# Patient Record
Sex: Female | Born: 1980 | Race: White | Hispanic: No | Marital: Married | Smoking: Never smoker
Health system: Southern US, Community
[De-identification: ages and names within clinical notes are randomized; demographics above are authoritative.]

## PROBLEM LIST (undated history)

## (undated) ENCOUNTER — Inpatient Hospital Stay (HOSPITAL_COMMUNITY): Payer: Self-pay

## (undated) DIAGNOSIS — I493 Ventricular premature depolarization: Secondary | ICD-10-CM

## (undated) DIAGNOSIS — I499 Cardiac arrhythmia, unspecified: Secondary | ICD-10-CM

## (undated) DIAGNOSIS — K834 Spasm of sphincter of Oddi: Secondary | ICD-10-CM

## (undated) DIAGNOSIS — O24419 Gestational diabetes mellitus in pregnancy, unspecified control: Secondary | ICD-10-CM

## (undated) DIAGNOSIS — N200 Calculus of kidney: Secondary | ICD-10-CM

## (undated) HISTORY — PX: CHOLECYSTECTOMY: SHX55

## (undated) HISTORY — DX: Calculus of kidney: N20.0

## (undated) HISTORY — DX: Ventricular premature depolarization: I49.3

---
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
## 2000-08-26 ENCOUNTER — Other Ambulatory Visit: Admission: RE | Admit: 2000-08-26 | Discharge: 2000-08-26 | Payer: Self-pay | Admitting: Internal Medicine

## 2000-10-10 ENCOUNTER — Emergency Department (HOSPITAL_COMMUNITY): Admission: EM | Admit: 2000-10-10 | Discharge: 2000-10-11 | Payer: Self-pay | Admitting: Emergency Medicine

## 2001-06-18 ENCOUNTER — Encounter: Payer: Self-pay | Admitting: Emergency Medicine

## 2001-06-18 ENCOUNTER — Emergency Department (HOSPITAL_COMMUNITY): Admission: EM | Admit: 2001-06-18 | Discharge: 2001-06-18 | Payer: Self-pay | Admitting: Emergency Medicine

## 2001-07-29 ENCOUNTER — Observation Stay (HOSPITAL_COMMUNITY): Admission: RE | Admit: 2001-07-29 | Discharge: 2001-07-30 | Payer: Self-pay | Admitting: *Deleted

## 2001-07-29 ENCOUNTER — Encounter: Payer: Self-pay | Admitting: *Deleted

## 2001-08-01 ENCOUNTER — Inpatient Hospital Stay (HOSPITAL_COMMUNITY): Admission: EM | Admit: 2001-08-01 | Discharge: 2001-08-04 | Payer: Self-pay | Admitting: Emergency Medicine

## 2001-08-01 ENCOUNTER — Encounter: Payer: Self-pay | Admitting: *Deleted

## 2001-08-02 ENCOUNTER — Encounter: Payer: Self-pay | Admitting: Surgery

## 2001-08-03 ENCOUNTER — Encounter: Payer: Self-pay | Admitting: *Deleted

## 2001-08-04 ENCOUNTER — Encounter: Payer: Self-pay | Admitting: *Deleted

## 2001-09-13 ENCOUNTER — Emergency Department (HOSPITAL_COMMUNITY): Admission: EM | Admit: 2001-09-13 | Discharge: 2001-09-13 | Payer: Self-pay | Admitting: Emergency Medicine

## 2001-10-06 ENCOUNTER — Other Ambulatory Visit: Admission: RE | Admit: 2001-10-06 | Discharge: 2001-10-06 | Payer: Self-pay | Admitting: Internal Medicine

## 2002-11-13 ENCOUNTER — Other Ambulatory Visit: Admission: RE | Admit: 2002-11-13 | Discharge: 2002-11-13 | Payer: Self-pay | Admitting: Internal Medicine

## 2003-08-02 ENCOUNTER — Other Ambulatory Visit: Admission: RE | Admit: 2003-08-02 | Discharge: 2003-08-02 | Payer: Self-pay | Admitting: Obstetrics & Gynecology

## 2003-08-02 ENCOUNTER — Inpatient Hospital Stay (HOSPITAL_COMMUNITY): Admission: AD | Admit: 2003-08-02 | Discharge: 2003-08-02 | Payer: Self-pay | Admitting: *Deleted

## 2004-02-01 ENCOUNTER — Inpatient Hospital Stay (HOSPITAL_COMMUNITY): Admission: AD | Admit: 2004-02-01 | Discharge: 2004-02-01 | Payer: Self-pay | Admitting: Obstetrics & Gynecology

## 2004-02-10 ENCOUNTER — Inpatient Hospital Stay (HOSPITAL_COMMUNITY): Admission: AD | Admit: 2004-02-10 | Discharge: 2004-02-10 | Payer: Self-pay | Admitting: Obstetrics and Gynecology

## 2004-02-13 ENCOUNTER — Inpatient Hospital Stay (HOSPITAL_COMMUNITY): Admission: AD | Admit: 2004-02-13 | Discharge: 2004-02-16 | Payer: Self-pay | Admitting: Obstetrics & Gynecology

## 2004-03-26 ENCOUNTER — Other Ambulatory Visit: Admission: RE | Admit: 2004-03-26 | Discharge: 2004-03-26 | Payer: Self-pay | Admitting: Obstetrics & Gynecology

## 2004-04-14 ENCOUNTER — Emergency Department (HOSPITAL_COMMUNITY): Admission: EM | Admit: 2004-04-14 | Discharge: 2004-04-14 | Payer: Self-pay

## 2004-04-18 ENCOUNTER — Emergency Department (HOSPITAL_COMMUNITY): Admission: EM | Admit: 2004-04-18 | Discharge: 2004-04-18 | Payer: Self-pay | Admitting: Family Medicine

## 2005-03-07 ENCOUNTER — Emergency Department (HOSPITAL_COMMUNITY): Admission: EM | Admit: 2005-03-07 | Discharge: 2005-03-07 | Payer: Self-pay | Admitting: Emergency Medicine

## 2005-03-31 ENCOUNTER — Encounter: Admission: RE | Admit: 2005-03-31 | Discharge: 2005-03-31 | Payer: Self-pay | Admitting: Gastroenterology

## 2005-04-21 ENCOUNTER — Encounter: Admission: RE | Admit: 2005-04-21 | Discharge: 2005-04-21 | Payer: Self-pay | Admitting: Gastroenterology

## 2005-07-29 ENCOUNTER — Other Ambulatory Visit: Admission: RE | Admit: 2005-07-29 | Discharge: 2005-07-29 | Payer: Self-pay | Admitting: Obstetrics & Gynecology

## 2006-01-27 ENCOUNTER — Emergency Department (HOSPITAL_COMMUNITY): Admission: EM | Admit: 2006-01-27 | Discharge: 2006-01-27 | Payer: Self-pay | Admitting: Family Medicine

## 2007-05-27 ENCOUNTER — Emergency Department (HOSPITAL_COMMUNITY): Admission: EM | Admit: 2007-05-27 | Discharge: 2007-05-27 | Payer: Self-pay | Admitting: Emergency Medicine

## 2007-06-01 DIAGNOSIS — A048 Other specified bacterial intestinal infections: Secondary | ICD-10-CM | POA: Insufficient documentation

## 2007-07-22 ENCOUNTER — Encounter: Admission: RE | Admit: 2007-07-22 | Discharge: 2007-07-22 | Payer: Self-pay | Admitting: Family Medicine

## 2007-09-14 ENCOUNTER — Ambulatory Visit (HOSPITAL_COMMUNITY): Admission: RE | Admit: 2007-09-14 | Discharge: 2007-09-14 | Payer: Self-pay | Admitting: General Surgery

## 2007-09-14 ENCOUNTER — Encounter (INDEPENDENT_AMBULATORY_CARE_PROVIDER_SITE_OTHER): Payer: Self-pay | Admitting: General Surgery

## 2007-09-15 ENCOUNTER — Inpatient Hospital Stay (HOSPITAL_COMMUNITY): Admission: EM | Admit: 2007-09-15 | Discharge: 2007-09-17 | Payer: Self-pay | Admitting: Emergency Medicine

## 2008-05-01 ENCOUNTER — Emergency Department (HOSPITAL_COMMUNITY): Admission: EM | Admit: 2008-05-01 | Discharge: 2008-05-01 | Payer: Self-pay | Admitting: Emergency Medicine

## 2008-07-13 DIAGNOSIS — D119 Benign neoplasm of major salivary gland, unspecified: Secondary | ICD-10-CM | POA: Insufficient documentation

## 2009-11-18 ENCOUNTER — Observation Stay (HOSPITAL_COMMUNITY): Admission: AD | Admit: 2009-11-18 | Discharge: 2009-11-20 | Payer: Self-pay | Admitting: Obstetrics and Gynecology

## 2009-11-22 ENCOUNTER — Emergency Department (HOSPITAL_COMMUNITY): Admission: EM | Admit: 2009-11-22 | Discharge: 2009-11-22 | Payer: Self-pay | Admitting: Emergency Medicine

## 2010-07-19 LAB — URINALYSIS, ROUTINE W REFLEX MICROSCOPIC
Bilirubin Urine: NEGATIVE
Glucose, UA: NEGATIVE mg/dL
Ketones, ur: NEGATIVE mg/dL
Leukocytes, UA: NEGATIVE
Nitrite: NEGATIVE
Protein, ur: NEGATIVE mg/dL
Specific Gravity, Urine: >1.03 — ABNORMAL HIGH (ref 1.005–1.030)
Urobilinogen, UA: 0.2 mg/dL (ref 0.0–1.0)
pH: 6 (ref 5.0–8.0)

## 2010-07-19 LAB — CBC
HCT: 40.4 % (ref 36.0–46.0)
Hemoglobin: 13.7 g/dL (ref 12.0–15.0)
MCH: 30.3 pg (ref 26.0–34.0)
MCHC: 33.8 g/dL (ref 30.0–36.0)
MCV: 89.6 fL (ref 78.0–100.0)
Platelets: 206 10*3/uL (ref 150–400)
RBC: 4.51 MIL/uL (ref 3.87–5.11)
RDW: 13 % (ref 11.5–15.5)
WBC: 9 10*3/uL (ref 4.0–10.5)

## 2010-07-19 LAB — POCT PREGNANCY, URINE: Preg Test, Ur: NEGATIVE

## 2010-07-19 LAB — STONE ANALYSIS: Stone Weight KSTONE: 0.005 g

## 2010-07-19 LAB — URINE MICROSCOPIC-ADD ON

## 2010-09-16 NOTE — Op Note (Signed)
NAME:  Melanie Joyce NO.:  [id number]   MEDICAL RECORD NO.:  [id number]          PATIENT TYPE:  AMB   LOCATION:  DAY                          FACILITY:  [facility]   PHYSICIAN:  Adolph Pollack, M.D.DATE OF BIRTH:  1980-12-23   DATE OF PROCEDURE:  09/14/2007  DATE OF DISCHARGE:                               OPERATIVE REPORT   PREOPERATIVE DIAGNOSIS:  Biliary dyskinesia.   POSTOPERATIVE DIAGNOSIS:  Biliary dyskinesia.   PROCEDURE:  Laparoscopic cholecystectomy with intraoperative  cholangiogram.   SURGEON:  Adolph Pollack, M.D.   ASSISTANT:  Wilmon Arms. Tsuei, M.D.   ANESTHESIA:  General.   INDICATIONS:  This 30 year old female has been having problems with  intermittent right upper quadrant pain, nausea and vomiting.  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  She had a recent ultrasound demonstrating gallbladder  polyps.  Apparently the biliary scan demonstrated a slightly depressed  gallbladder ejection fraction.  She continues to be symptomatic.  After  discussion with her about the procedure and its success rate, she has  elected to undergo laparoscopic cholecystectomy.   TECHNIQUE:  She is seen in the holding area and brought to the operating  room, placed supine on the operating table and general anesthetic was  administered.  The abdominal wall was sterilely prepped and draped.  Marcaine was infiltrated in the subumbilical region.  A subumbilical  incision was made through the skin, subcutaneous tissue, fascia and  peritoneum entering the peritoneal cavity under direct vision.  A  pursestring suture of 0-0 Vicryl was placed around the fascial edges.  A  Hasson trocar was introduced into the peritoneal cavity and  pneumoperitoneum created by insufflation of CO2 gas.   She was then placed in reverse Trendelenburg position, the right side  tilted slightly up.  An 11 mm trocar was placed an  epigastric incision  and two 5 mm trocars placed in the right mid lateral abdomen.  The  gallbladder was visualized and had a pale white reddish color at the  fundus area.  Fundus was grasped, retracted toward the right shoulder.  No significant adhesions were noted.  The infundibulum was grasped,  retracted laterally and then it was mobilized using careful dissection  on the gallbladder.  The cystic duct was isolated.  Using blunt  dissection, a window was created around it.  A clip was placed just  above the cystic duct gallbladder junction.  A small incision was made  at the cystic duct gallbladder junction.  A cholangiocath was passed  through the anterior abdominal wall and placed into the cystic duct and  a cholangiogram was performed.   Under real time fluoroscopy, dilute contrast was injected into the  cystic duct which was of moderate length.  Common hepatic, right and  left hepatic, and common bile ducts all filled promptly and contrast  drained promptly into the duodenum without obvious evidence of  obstruction.  Final reports pending the radiologist's interpretation.   Following this, the cholangiocatheter was removed, the cystic duct was  then clipped 3 times on the biliary  side and then divided.  The cystic  artery was identified and a window created around it close to the  gallbladder.  It was clipped and divided.  The gallbladder was then  dissected free from liver using electrocautery.  The gallbladder was  placed in an Endopouch bag.  I inspected the gallbladder fossa and  controlled bleeding with electrocautery.  I then irrigated the area  copiously with saline solution.  Further inspection demonstrated  hemostasis was adequate.  There was no bile leak.  Fluid was evacuated.   The gallbladder was then removed through the subumbilical port.  The  subumbilical fascial defect was closed under laparoscopic vision by  tightening up and tying down the pursestring suture.   Remaining trocars  were removed and the pneumoperitoneum was released.   Skin incisions were closed with 4-0 Monocryl subcuticular stitches  followed by Steri-Strips and sterile dressings.  She tolerated the  procedure well without apparent complications and was taken to the  recovery room in satisfactory condition.      Adolph Pollack, M.D.  Electronically Signed     TJR/MEDQ  D:  09/14/2007  T:  09/14/2007  Job:  [id number]   cc:   Maryelizabeth Rowan, M.D.

## 2010-09-19 NOTE — Procedures (Signed)
[facility]  Patient:    Melanie Joyce Visit Number: [id number] MRN: [id number]          Service Type: SUR Location: [phone] Attending Physician:  Sabino Gasser Dictated by:   Sabino Gasser, M.D. Proc. Date: 07/29/01 Admit Date:  07/29/2001                             Procedure Report  PROCEDURE:  Endoscopic retrograde cholangiopancreatography with sphincterotomy.  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  ANESTHESIA:  Demerol 100, Versed 11.5 mg.  DESCRIPTION OF PROCEDURE:  With the patient mildly sedated in the prone position, the Olympus videoscopic side-viewing duodenoscope was inserted in the mouth and passed through the esophagus into the stomach and duodenum where the ampulla of Vater was visualized, photographed and appeared normal. Subsequently the Wilson-Cook tritome catheter was advanced through the endoscope and cannulation of the ampulla was easily made. A small amount of contrast was injected which outlined the beginnings of the pancreatic duct which appeared grossly normal and we stopped with the injection. Subsequently with change of position, a guidewire was passed and it allowed [place] to enter the common bile duct. With the guidewire in place, the catheter was passed proximally into the biliary tree, contrast was injected and outlined what appeared to be normal intra and extrahepatic bile ducts and a normal gallbladder. A radiograph was taken at this point. Subsequently then the tritome catheter was pulled back and a sphincterotomy was made. It was noted that we had good drainage of the contrast material with the sphincterotomy and some bile was seen to emanate from the ampulla. Therefore at that point, with the sphincterotomy made up to the overlying fold, we withdrew the catheter and removed the endoscope. The patients vital signs and pulse oximeter remained stable. The patient  tolerated the procedure well without apparent complications.  FINDINGS:  Essentially normal biliary and pancreatic ducts that were seen presumed sphincter of Oddi dysfunction, therefore, a sphincterotomy done.  PLAN:  Will have the patient on a clear liquid diet today and advance as tolerated in the morning. Have the patient follow-up with me as needed and in approximately six weeks. Dictated by:   Sabino Gasser, M.D. Attending Physician:  Sabino Gasser DD:  07/29/01 TD:  07/29/01 Job: [id number]

## 2010-09-19 NOTE — Discharge Summary (Signed)
[facility]  Patient:    Melanie Joyce [facility] Visit Number: [id number] MRN: [id number]          Service Type: MED Location: [phone] Attending Physician:  Sabino Gasser Dictated by:   Sabino Gasser, M.D. Admit Date:  08/01/2001 Discharge Date: 08/04/2001                             Discharge Summary  HISTORY OF PRESENT ILLNESS:  Ms. Melanie Joyce is a 30 year old lady who was admitted with abdominal pain. The patient had ERCP on Friday prior to admission.  She was discharged home the following day.  On Sunday evening, early Monday morning, the patient developed abdominal pain and came to the emergency room at our suggestion.  HOSPITAL COURSE:  Laboratory studies were unremarkable; however, an x-ray of the abdomen revealed pneumoperitoneum.  The patient was admitted.  Actually there appeared to be retroperitoneal air, but the patients exam was unremarkable, and labs were unremarkable as were vital signs.  The patient was admitted for observation and seen in consultation by Dr. Jamey Ripa of the surgical service.  It was felt that she could be admitted and observed, and the patients condition improved throughout her hospital stay.  Abdomen remained benign.  Vital signs remained stable.  The patient tolerated a regular diet subsequently and was discharged to be followed by me as an outpatient.  MEDICATIONS:  The patient was on no medications.  DISCHARGE LABS:  Hemoglobin 13.0, white count 7300.  Electrolytes, liver function tests, amylase, lipase were all normal.  A gastrograph and swallow was done during this hospitalization, which showed no extravasation.  Follow-up x-ray showed improvement of the retroperitoneal air.  DISPOSITION:  The patient was discharged in improved condition, on no medications.  To be followed up as described. Dictated by:   Sabino Gasser, M.D. Attending Physician:  Sabino Gasser DD:  08/15/01 TD:  08/15/01 Job: [id number]

## 2010-09-19 NOTE — H&P (Signed)
NAME:  Melanie Joyce NO.:  [id number]   MEDICAL RECORD NO.:  [id number]          PATIENT TYPE:  INP   LOCATION:  9168                          FACILITY:  [facility]   PHYSICIAN:  Guy Sandifer. Tomblin II, M.D.DATE OF BIRTH:  [birth year]   DATE OF ADMISSION:  02/13/2004  DATE OF DISCHARGE:                                HISTORY & PHYSICAL   CHIEF COMPLAINT:  Intrauterine pregnancy at 37-6/7 weeks with cholestasis of  pregnancy.   HISTORY OF PRESENT ILLNESS:  This patient is a 30 year old white female, G1,  P0 with an EDC of February 28, 2004, established by 5-6 ultrasound, presented  to triage two to three days ago with itching of the soles of her feet and  the palms of her hands, especially at night.  They have grown progressively  worse.  Lab results returned today revealing an elevation of the bile acids  to 34, consistent with cholestasis of pregnancy.  CMV titer for IgG was  positive at 7.62.  IgM is pending.  Nonstress in the office revealed one  variable deceleration with a contraction.  Risks of cholestasis during the  pregnancy are discussed with the patient.  She is being admitted for a two  stage induction of labor.  She denies central nervous system changes,  bleeding, or leaking of fluid.   PAST MEDICAL HISTORY/PAST MEDICAL HISTORY/PAST OBSTETRICAL HISTORY/FAMILY  HISTORY:  Per prenatal history.   MEDICATIONS:  Prenatal vitamins.   ALLERGIES:  No known drug allergies.   SOCIAL HISTORY:  Denies alcohol, tobacco or drug abuse.   REVIEW OF SYSTEMS:  Neurological:  Denies headache.  Cardiac:  Denies chest  pain.  Pulmonary:  Denies shortness of breath.  Gastrointestinal:  Denies  recent changes in bowel habits.   PHYSICAL EXAMINATION:  VITAL SIGNS:  Height 5 feet 5 inches, weight 143-1/2  pounds.  Blood pressure 112/72.  HEENT:  Without thyromegaly.  LUNGS:  Clear to auscultation.  HEART:  Regular rate and rhythm.  BACK:  Without CVA tenderness.  BREASTS:   Not examined.  ABDOMEN:  Gravida with no epigastric tenderness.  PELVIC:  The cervix is closed, 50% effaced, -2 station and vertex.  EXTREMITIES:  1+ edema.  Deep tendon reflexes are 2+ without clonus.   LABORATORY DATA:  Blood type O positive.  Rh antibody screen negative.  Toxoplasmosis titer negative.  RPR nonreactive.  Rubella titer immune.  Hepatitis B surface antigen is negative.  HIV nonreactive.  Gonorrhea and  Chlamydia negative.   ASSESSMENT:  1.  Intrauterine pregnancy at 37-6/7 weeks.  2.  Cholestasis of pregnancy.   PLAN:  Two stage induction of labor.      JET/MEDQ  D:  02/13/2004  T:  02/13/2004  Job:  [id number]

## 2010-09-19 NOTE — Consult Note (Signed)
[facility]  Patient:    Melanie Joyce Visit Number: [id number] MRN: [id number]          Service Type: MED Location: [phone] Attending Physician:  Sabino Gasser Dictated by:   Currie Paris, M.D. Proc. Date: 08/01/01 Admit Date:  08/01/2001   CC:         Sabino Gasser, M.D.   Consultation Report  VISIT NUMBER:  [id number]  REASON FOR CONSULTATION:  Abdominal pain and retroperitoneal air, status post endoscopic retrograde cholangiopancreatography.  CLINICAL HISTORY:  Ms. Melanie Joyce is a 30 year old woman who underwent endoscopic retrograde cholangiopancreatography and sphincterotomy a few days ago.  She apparently developed some abdominal pain shortly thereafter, and over the weekend has continued having pain all weekend.  She has had a minimal amount of nausea, but no vomiting.  She said she did have a little bit of diarrhea. She presented back today, and CT scan was obtained which shows what appears to be retroperitoneal air, some air in the mediastinum, perhaps a small amount of free air.  Interesting to note, is that there is no evidence of leakage of contrast out of the stomach, and contrast gets all the way into the colon.  In addition, there was no evidence of fluid collections or abscesses present.  PAST MEDICAL HISTORY:  The patient states that she is generally in good health.  She is only on birth control pills.  ALLERGIES:  No known drug allergies.  PAST SURGICAL HISTORY:  None.  PHYSICAL EXAMINATION:  GENERAL:  The patient is alert, but a little bit sleepy secondary to her Demerol.  VITAL SIGNS:  Stable in the emergency room.  She is in no distress, and appears reasonably comfortable.  HEART:  She is not tachypneic or tachycardic particularly.  No murmurs, rubs, or gallops.  HEENT:  Head is normocephalic, eyes are nonicteric.  Pupils are round and regular.  NECK:  Supple, no masses or thyromegaly.  LUNGS:  Clear to  auscultation.  ABDOMEN:  Basically soft, although she has a little bit of tenderness in the right upper quadrant.  There is not any significant guarding, and as noted, the abdomen is soft without any diffuse guarding, and there is no rebound present.  Bowel sounds are present and normal.  EXTREMITIES:  No cyanosis or edema.  LABORATORY DATA:  Slightly low potassium of 3.3.  White count 10,500, but no significant left shift.  Lipase and amylase are normal.  I reviewed the CT scan with radiology, and there does appear to be air as described above.  IMPRESSION:  Probable duodenal perforation, most likely occurring at the time of the endoscopic retrograde cholangiopancreatography.  I suspect a large quantity of air is secondary to the insufflation occurring during the endoscopic retrograde cholangiopancreatography.  However, I do not believe there is any ongoing infectious process, and I believe the leak has most likely sealed fairly quickly, and was probably only a microscopic perforation based on her clinical course.  RECOMMENDATIONS:  The current plans I believe are appropriate.  Dr. Virginia Rochester has ordered antibiotics, and scheduled both a HIDA scan to rule out a bile leak, and a Gastrografin study to better delineate whether there is any contrast leaking, but I think both of these are likely to be negative.  She has a NG in to keep her stomach decompressed, and I believe she will most likely improve over the next few days and not require operative intervention, and likely will have complete resolution of this process.  Dictated by:   Currie Paris, M.D. Attending Physician:  Sabino Gasser DD:  08/01/01 TD:  08/01/01 Job: [id number]

## 2010-09-19 NOTE — Discharge Summary (Signed)
NAME:  Evette Doffing NO.:  [id number]   MEDICAL RECORD NO.:  [id number]          PATIENT TYPE:  INP   LOCATION:  1301                         FACILITY:  [facility]   PHYSICIAN:  Adolph Pollack, M.D.DATE OF BIRTH:  24-Jan-1981   DATE OF ADMISSION:  09/15/2007  DATE OF DISCHARGE:  09/17/2007                               DISCHARGE SUMMARY   PRINCIPAL DISCHARGE DIAGNOSIS:  Right upper quadrant abdominal pain,  post cholecystectomy.   SECONDARY DIAGNOSIS:  None.   REASON FOR ADMISSION:  This is a 30 year old female who underwent a  laparoscopic cholecystectomy and went home the same day and then  presented about 24 hours later with increasing right upper quadrant  pain.  She is noted have a leukocytosis and a very mild elevation of  SGOT, SGPT.  She subsequently was admitted.   She was admitted to the hospital, underwent a hepatobiliary scan which  showed no leak.  May 15 she was feeling much better and was started on a  liquid diet, which she tolerated, and was able to go home Sep 17, 2007,  and was asymptomatic.   DISPOSITION:  Discharged home Sep 17, 2007.  No complications from  gallbladder surgery are noted.  She was to continue her previous  instructions and follow up in the office in 2-3 weeks.      Adolph Pollack, M.D.  Electronically Signed     TJR/MEDQ  D:  10/13/2007  T:  10/13/2007  Job:  [id number]

## 2010-12-17 ENCOUNTER — Other Ambulatory Visit: Payer: Self-pay

## 2011-01-23 LAB — URINALYSIS, ROUTINE W REFLEX MICROSCOPIC
Bilirubin Urine: NEGATIVE
Glucose, UA: NEGATIVE
Hgb urine dipstick: NEGATIVE
Ketones, ur: NEGATIVE
Nitrite: NEGATIVE
Protein, ur: NEGATIVE
Specific Gravity, Urine: 1.024
Urobilinogen, UA: 0.2
pH: 7

## 2011-01-23 LAB — POCT PREGNANCY, URINE
Operator id: 29021
Preg Test, Ur: NEGATIVE

## 2011-01-23 LAB — CBC
HCT: 37.3
MCV: 85.5
Platelets: 227
RBC: 4.36
WBC: 9

## 2011-01-23 LAB — COMPREHENSIVE METABOLIC PANEL
BUN: 11
CO2: 23
Chloride: 103
Creatinine, Ser: 0.71
GFR calc non Af Amer: >60
Total Bilirubin: 0.6

## 2011-01-23 LAB — LIPASE, BLOOD: Lipase: 27

## 2011-01-23 LAB — DIFFERENTIAL
Basophils Absolute: 0
Lymphocytes Relative: 32
Neutro Abs: 5.5

## 2011-01-28 LAB — CBC
Hemoglobin: 11 — ABNORMAL LOW
RBC: 3.75 — ABNORMAL LOW
RDW: 13

## 2011-01-28 LAB — COMPREHENSIVE METABOLIC PANEL
ALT: 32
Alkaline Phosphatase: 33 — ABNORMAL LOW
CO2: 25
Glucose, Bld: 103 — ABNORMAL HIGH
Potassium: 3.6
Sodium: 137
Total Protein: 5.3 — ABNORMAL LOW

## 2011-02-06 LAB — DIFFERENTIAL
Eosinophils Relative: 0 % (ref 0–5)
Lymphocytes Relative: 7 % — ABNORMAL LOW (ref 12–46)
Lymphs Abs: 1.1 10*3/uL (ref 0.7–4.0)
Monocytes Absolute: 0.5 10*3/uL (ref 0.1–1.0)
Monocytes Relative: 3 % (ref 3–12)

## 2011-02-06 LAB — COMPREHENSIVE METABOLIC PANEL
AST: 43 U/L — ABNORMAL HIGH (ref 0–37)
Albumin: 3.4 g/dL — ABNORMAL LOW (ref 3.5–5.2)
Calcium: 8.6 mg/dL (ref 8.4–10.5)
Chloride: 107 mEq/L (ref 96–112)
Creatinine, Ser: 0.68 mg/dL (ref 0.4–1.2)
GFR calc Af Amer: >60 mL/min (ref >60)
Total Bilirubin: 1.3 mg/dL — ABNORMAL HIGH (ref 0.3–1.2)
Total Protein: 6.3 g/dL (ref 6.0–8.3)

## 2011-02-06 LAB — CBC
MCV: 89.8 fL (ref 78.0–100.0)
Platelets: 202 10*3/uL (ref 150–400)
WBC: 16.5 10*3/uL — ABNORMAL HIGH (ref 4.0–10.5)

## 2011-05-05 DIAGNOSIS — O24419 Gestational diabetes mellitus in pregnancy, unspecified control: Secondary | ICD-10-CM

## 2011-05-05 HISTORY — DX: Gestational diabetes mellitus in pregnancy, unspecified control: O24.419

## 2011-05-05 NOTE — L&D Delivery Note (Signed)
Delivery Note At  a viable female  was delivered via OA   APGAR:9 9  Placenta status: spont, intact with 3 vessel cord.  Cord:  with the following complications: none  Anesthesia:  none Episiotomy: none Lacerations: none Suture Repair: not applicable Est. Blood Loss (mL): 300   Mom to postpartum.  Baby to nursery-stable.  Destinee Taber L 12/22/2011, 1:26 AM

## 2011-05-25 LAB — OB RESULTS CONSOLE RPR: RPR: NONREACTIVE

## 2011-05-25 LAB — OB RESULTS CONSOLE HIV ANTIBODY (ROUTINE TESTING): HIV: NONREACTIVE

## 2011-05-25 LAB — OB RESULTS CONSOLE RUBELLA ANTIBODY, IGM: Rubella: IMMUNE

## 2011-05-25 LAB — OB RESULTS CONSOLE HEPATITIS B SURFACE ANTIGEN: Hepatitis B Surface Ag: NEGATIVE

## 2011-06-23 LAB — OB RESULTS CONSOLE GC/CHLAMYDIA
Chlamydia: NEGATIVE
Gonorrhea: NEGATIVE

## 2011-09-16 ENCOUNTER — Inpatient Hospital Stay (HOSPITAL_COMMUNITY)
Admission: AD | Admit: 2011-09-16 | Discharge: 2011-09-17 | Disposition: A | Discharge status: Home / Self Care | Payer: 59 | Source: Ambulatory Visit | Attending: Obstetrics and Gynecology | Admitting: Obstetrics and Gynecology

## 2011-09-16 ENCOUNTER — Inpatient Hospital Stay (HOSPITAL_COMMUNITY): Payer: 59

## 2011-09-16 ENCOUNTER — Encounter (HOSPITAL_COMMUNITY): Payer: Self-pay | Admitting: *Deleted

## 2011-09-16 DIAGNOSIS — O99891 Other specified diseases and conditions complicating pregnancy: Secondary | ICD-10-CM | POA: Insufficient documentation

## 2011-09-16 DIAGNOSIS — K429 Umbilical hernia without obstruction or gangrene: Secondary | ICD-10-CM | POA: Insufficient documentation

## 2011-09-16 DIAGNOSIS — R109 Unspecified abdominal pain: Secondary | ICD-10-CM | POA: Insufficient documentation

## 2011-09-16 NOTE — MAU Provider Note (Signed)
Chief Complaint:  Abdominal Pain   First Provider Initiated Contact with Patient 09/16/11 2304      HPI  Melanie Joyce is a 31 y.o. G1P0 at [redacted]w[redacted]d presenting with new onset of severe umbilical pain today w/ mvmt or palpation. No pain at rest. Has not tried any pain relief measures. Denies mass at site.  Denies contractions, leakage of fluid or vaginal bleeding. Good fetal movement.    Past Medical History: Kidney Stones Cholestasis of pregnancy  Past Surgical History: Cholecystectomy  Family History: No family history on file.  Social History: History  Substance Use Topics  . Smoking status: Not on file  . Smokeless tobacco: Not on file  . Alcohol Use: Not on file    Allergies: No Known Allergies  Meds:  Prescriptions prior to admission  Medication Sig Dispense Refill  . acetaminophen (TYLENOL) 500 MG tablet Take 500 mg by mouth every 6 (six) hours as needed. Takes for pain      . cetirizine (ZYRTEC) 10 MG tablet Take 10 mg by mouth every morning.      . Prenatal Vit-Fe Fumarate-FA (PRENATAL MULTIVITAMIN) TABS Take 1 tablet by mouth at bedtime.          Physical Exam  Blood pressure 126/56, pulse 89, temperature 98.2 F (36.8 C), temperature source Oral, resp. rate 16, height 5\' 5"  (1.651 m), weight 63.231 kg (139 lb 6.4 oz). GENERAL: Well-developed, well-nourished female in no acute distress at rest, but significant guarding w/ palpation, valsalva and upon sitting.  HEENT: normocephalic, good dentition HEART: normal rate RESP: normal effort ABDOMEN: Soft, severely tender at umbilicus, nondistended, gravid. Laparoscopy scar inferior to umbilicus.  EXTREMITIES: Nontender, no edema NEURO: alert and oriented  SPECULUM EXAM: Deferred   FHT:  Baseline 140 , moderate variability, no accelerations, no decelerations. Reassuring for gestation Contractions: None   Labs:   Imaging:  [place] Misc Soft Tissue  09/17/2011  *RADIOLOGY REPORT*  Clinical Data:  Periumbilical pain.  Second trimester pregnancy.  ULTRASOUND OF PREIUMBILICAL SOFT TISSUES  Technique:  Ultrasound examination of the periumbilical soft tissues was performed in the area of clinical concern.  Comparison:  11/18/2009  Findings: The patient has a small incisional scar just above the umbilicus from laparoscopic cholecystectomy port.  The patient was tender in the vicinity of the umbilicus.  No herniated bowel noted.  There is some low echogenicity in the vicinity of the patient's discomfort which could represent edematous adipose tissue, or conceivably the underlying scar tissue.  A well-defined extension of intra-abdominal contents into this vicinity is not well shown.  IMPRESSION:  1.  No herniation of bowel is demonstrated. 2.  Low echogenicity in the vicinity of the umbilicus may be from scarring or low-level localized edema.  We do not demonstrate definite extension of intra-abdominal contents into the periumbilical region, although given the shadowing due to the umbilicus, the possibility of a small amount of omental herniation is difficult to totally exclude.  No drainable fluid collection is identified.  Original Report Authenticated By: Dellia Cloud, M.D.    Assessment: 1. Umbilical hernia without obstruction or gangrene    Plan: D/C home Follow-up Information    Follow up with LOWE,DAVID C, MD on 10/13/2011. (or MAU/Emergency room as needed if symptoms worsen)    Contact information:   [address], [address] [place] [address] [phone]         Medication List  As of 09/17/2011 12:43 AM   CONTINUE taking these  medications         acetaminophen 500 MG tablet   Commonly known as: TYLENOL      cetirizine 10 MG tablet   Commonly known as: ZYRTEC      prenatal multivitamin Tabs          Reviewed Sx of incarcerated hernia PTL precautions Comfort measures Declines pain meds  [name] 5/15/201311:17 PM

## 2011-09-16 NOTE — MAU Note (Signed)
Pt G2 P1 at 24.4wks having umbilicus pain that started today, becomes intense with movement.

## 2011-09-17 NOTE — MAU Note (Signed)
D/C HOME W/O C/O--

## 2011-10-28 ENCOUNTER — Encounter: Payer: 59 | Attending: Obstetrics and Gynecology | Admitting: *Deleted

## 2011-10-28 ENCOUNTER — Encounter: Payer: Self-pay | Admitting: *Deleted

## 2011-10-28 DIAGNOSIS — O99810 Abnormal glucose complicating pregnancy: Secondary | ICD-10-CM | POA: Insufficient documentation

## 2011-10-28 DIAGNOSIS — Z713 Dietary counseling and surveillance: Secondary | ICD-10-CM | POA: Insufficient documentation

## 2011-10-28 NOTE — Progress Notes (Signed)
  Patient was seen on 10/28/2011 for Gestational Diabetes self-management class at the [facility]. The following learning objectives were met by the patient during this course:   States the definition of Gestational Diabetes  States why dietary management is important in controlling blood glucose  Describes the effects each nutrient has on blood glucose levels  Demonstrates ability to create a balanced meal plan  Demonstrates carbohydrate counting   States when to check blood glucose levels  Demonstrates proper blood glucose monitoring techniques  States the effect of stress and exercise on blood glucose levels  States the importance of limiting caffeine and abstaining from alcohol and smoking  Blood glucose monitor given: Accu Chek Nano BG Monitoring Kit Lot # [id number] Exp: 01/31/2013 Blood glucose reading: 84 mg/dl  Patient instructed to monitor glucose levels: FBS: 60 - <90 2 hour: <120  *Patient received handouts:  Nutrition Diabetes and Pregnancy  Carbohydrate Counting List  Patient will be seen for follow-up as needed.

## 2011-10-28 NOTE — Patient Instructions (Signed)
Goals:  Check glucose levels per MD as instructed  Follow Gestational Diabetes Diet as instructed  Call for follow-up as needed    

## 2011-11-30 ENCOUNTER — Inpatient Hospital Stay (HOSPITAL_COMMUNITY)
Admission: AD | Admit: 2011-11-30 | Discharge: 2011-12-01 | Disposition: A | Discharge status: Home / Self Care | Payer: 59 | Source: Ambulatory Visit | Attending: Obstetrics and Gynecology | Admitting: Obstetrics and Gynecology

## 2011-11-30 ENCOUNTER — Encounter (HOSPITAL_COMMUNITY): Payer: Self-pay | Admitting: *Deleted

## 2011-11-30 DIAGNOSIS — O4700 False labor before 37 completed weeks of gestation, unspecified trimester: Secondary | ICD-10-CM | POA: Insufficient documentation

## 2011-11-30 DIAGNOSIS — O479 False labor, unspecified: Secondary | ICD-10-CM

## 2011-11-30 DIAGNOSIS — E860 Dehydration: Secondary | ICD-10-CM | POA: Insufficient documentation

## 2011-11-30 HISTORY — DX: Gestational diabetes mellitus in pregnancy, unspecified control: O24.419

## 2011-11-30 LAB — URINE MICROSCOPIC-ADD ON

## 2011-11-30 LAB — URINALYSIS, ROUTINE W REFLEX MICROSCOPIC
Bilirubin Urine: NEGATIVE
Glucose, UA: NEGATIVE mg/dL
Hgb urine dipstick: NEGATIVE
Specific Gravity, Urine: >1.03 — ABNORMAL HIGH (ref 1.005–1.030)
pH: 6 (ref 5.0–8.0)

## 2011-11-30 MED ORDER — NIFEDIPINE 10 MG PO CAPS
10.0000 mg | ORAL_CAPSULE | Freq: Once | ORAL | Status: AC
  Administered 2011-11-30: 10 mg via ORAL
  Filled 2011-11-30: qty 1

## 2011-11-30 MED ORDER — LACTATED RINGERS IV SOLN: Freq: Once | INTRAVENOUS | Status: DC

## 2011-11-30 MED ORDER — TERBUTALINE SULFATE 1 MG/ML IJ SOLN
0.2500 mg | Freq: Once | INTRAMUSCULAR | Status: DC
  Filled 2011-11-30: qty 1

## 2011-11-30 MED ORDER — DEXTROSE IN LACTATED RINGERS 5 % IV SOLN
Freq: Once | INTRAVENOUS | Status: AC
  Administered 2011-11-30: 23:00:00 via INTRAVENOUS

## 2011-11-30 NOTE — MAU Note (Signed)
PT SAYS PAIN - PRESSURE STARTED AT 530PM-   HAS HAD  THIS BEFORE-  BUT THESE DO NOT GO AWAY.   NO VE.    DENIES HSV AND MRSA

## 2011-11-30 NOTE — MAU Provider Note (Signed)
History     CSN: [id number]  Arrival date and time: 11/30/11 2043   First Provider Initiated Contact with Patient 11/30/11 2141      Chief Complaint  Patient presents with  . Contractions   HPI  Pt is here with report of contractions that started at 1700.  Pt reports that she is feeling increased pressure.  Denies recent intercourse, vaginal bleeding or leaking of fluid.    Past Medical History  Diagnosis Date  . Gestational diabetes 2013    diet controlled    Past Surgical History  Procedure Date  . Cholecystectomy     Family History  Problem Relation Age of Onset  . Asthma Other   . Hypertension Other   . Hyperlipidemia Other   . Cancer Other   . COPD Other     History  Substance Use Topics  . Smoking status: Never Smoker   . Smokeless tobacco: Not on file  . Alcohol Use: No    Allergies: No Known Allergies  Prescriptions prior to admission  Medication Sig Dispense Refill  . Prenatal Vit-Fe Fumarate-FA (PRENATAL MULTIVITAMIN) TABS Take 1 tablet by mouth at bedtime.        Review of Systems  Gastrointestinal: Positive for abdominal pain.  All other systems reviewed and are negative.   Physical Exam   Blood pressure 133/53, pulse 67, temperature 98.5 F (36.9 C), temperature source Oral, resp. rate 20, height 5\' 4"  (1.626 m), weight 63.73 kg (140 lb 8 oz).  Physical Exam  Constitutional: She is oriented to person, place, and time. She appears well-developed and well-nourished. No distress.  HENT:  Head: Normocephalic.  Neck: Normal range of motion. Neck supple.  Cardiovascular: Normal rate, regular rhythm and normal heart sounds.   Respiratory: Effort normal and breath sounds normal.  GI: Soft. There is no tenderness.  Genitourinary: No bleeding around the vagina. Vaginal discharge (mucusy) found.       Cervix - closed  Neurological: She is alert and oriented to person, place, and time.  Skin: Skin is warm and dry.    MAU Course   Procedures  Consulted with Dr. Neomia Dear HPI/exam>give dose of subQ terbutaline and discharge home>pt reports history of SVT>gave procardia 10 mg instead  Results for orders placed during the hospital encounter of 11/30/11 (from the past 24 hour(s))  URINALYSIS, ROUTINE W REFLEX MICROSCOPIC     Status: Abnormal   Collection Time   11/30/11  9:50 PM      Component Value Range   Color, Urine YELLOW  YELLOW   APPearance CLEAR  CLEAR   Specific Gravity, Urine >1.030 (*) 1.005 - 1.030   pH 6.0  5.0 - 8.0   Glucose, UA NEGATIVE  NEGATIVE mg/dL   Hgb urine dipstick NEGATIVE  NEGATIVE   Bilirubin Urine NEGATIVE  NEGATIVE   Ketones, ur >80 (*) NEGATIVE mg/dL   Protein, ur NEGATIVE  NEGATIVE mg/dL   Urobilinogen, UA 0.2  0.0 - 1.0 mg/dL   Nitrite NEGATIVE  NEGATIVE   Leukocytes, UA SMALL (*) NEGATIVE  URINE MICROSCOPIC-ADD ON     Status: Abnormal   Collection Time   11/30/11  9:50 PM      Component Value Range   Squamous Epithelial / LPF FEW (*) RARE   WBC, UA 7-10  <3 WBC/hpf   RBC / HPF 0-2  <3 RBC/hpf   Bacteria, UA FEW (*) RARE   Urine-Other MUCOUS PRESENT     1 Liter IV d5LR  Cervix recheck -  no change; pt reports improvement in pain  FHR 120's, +accels, reactive  Assessment and Plan  Dehydration Braxton Hicks  Plan: DC to home Labor precautions Increase fluids  [facility] 11/30/2011, 9:44 PM

## 2011-12-19 ENCOUNTER — Inpatient Hospital Stay (HOSPITAL_COMMUNITY)
Admission: AD | Admit: 2011-12-19 | Discharge: 2011-12-19 | Disposition: A | Discharge status: Home / Self Care | Payer: 59 | Source: Ambulatory Visit | Attending: Obstetrics and Gynecology | Admitting: Obstetrics and Gynecology

## 2011-12-19 ENCOUNTER — Encounter (HOSPITAL_COMMUNITY): Payer: Self-pay | Admitting: *Deleted

## 2011-12-19 DIAGNOSIS — O26899 Other specified pregnancy related conditions, unspecified trimester: Secondary | ICD-10-CM

## 2011-12-19 DIAGNOSIS — O99891 Other specified diseases and conditions complicating pregnancy: Secondary | ICD-10-CM | POA: Insufficient documentation

## 2011-12-19 HISTORY — DX: Cardiac arrhythmia, unspecified: I49.9

## 2011-12-19 NOTE — MAU Provider Note (Signed)
  History     CSN: [id number]  Arrival date and time: 12/19/11 1513   None     Chief Complaint  Patient presents with  . Vaginal Discharge   HPI 31 y.o. G2P1001 at [redacted]w[redacted]d with greenish discharge, mucousy, starting yesterday. States she lost her mucous plug yesterday, has noticed green discharge a few times since. No pain, bleeding, LOF. + fetal movement.    Past Medical History  Diagnosis Date  . Gestational diabetes 2013    diet controlled  . Cardiac arrhythmia     PVCs    Past Surgical History  Procedure Date  . Cholecystectomy     Family History  Problem Relation Age of Onset  . Asthma Other   . Hypertension Other   . Hyperlipidemia Other   . Cancer Other   . COPD Other     History  Substance Use Topics  . Smoking status: Never Smoker   . Smokeless tobacco: Not on file  . Alcohol Use: No    Allergies: No Known Allergies  No prescriptions prior to admission    Review of Systems  Constitutional: Negative.   Respiratory: Negative.   Cardiovascular: Negative.   Gastrointestinal: Negative for nausea, vomiting, abdominal pain, diarrhea and constipation.  Genitourinary: Negative for dysuria, urgency, frequency, hematuria and flank pain.       Negative for vaginal bleeding, cramping/contractions  Musculoskeletal: Negative.   Neurological: Negative.   Psychiatric/Behavioral: Negative.    Physical Exam   Blood pressure 122/53, pulse 60, temperature 97.9 F (36.6 C), temperature source Oral, resp. rate 20, weight 141 lb (63.957 kg).  Physical Exam  Vitals reviewed. Constitutional: She is oriented to person, place, and time. She appears well-developed and well-nourished. No distress.  Cardiovascular: Normal rate.   Respiratory: Effort normal.  GI: Soft. There is no tenderness.  Genitourinary: Vaginal discharge (creamy, greenish white, no odor) found.  Musculoskeletal: Normal range of motion.  Neurological: She is alert and oriented to person, place, and  time.  Skin: Skin is warm and dry.  Psychiatric: She has a normal mood and affect.   NST reactive MAU Course  Procedures  Fern negative  Assessment and Plan  31 y.o. G2P1001 at [redacted]w[redacted]d Membranes intact, no evidence of labor, stable maternal-fetal status F/U as scheduled  Sondra Blixt 12/19/2011, 3:58 PM

## 2011-12-19 NOTE — MAU Note (Signed)
Pt states, " I have seen green vaginal discharge since 4 pm on Friday. I don't know if it is my mucous plug or what."

## 2011-12-21 ENCOUNTER — Encounter (HOSPITAL_COMMUNITY): Payer: Self-pay | Admitting: *Deleted

## 2011-12-21 ENCOUNTER — Inpatient Hospital Stay (HOSPITAL_COMMUNITY)
Admission: AD | Admit: 2011-12-21 | Discharge: 2011-12-24 | DRG: 775 | Disposition: A | Discharge status: Home / Self Care | Payer: 59 | Source: Ambulatory Visit | Attending: Obstetrics and Gynecology | Admitting: Obstetrics and Gynecology

## 2011-12-21 NOTE — MAU Note (Signed)
cntractions 

## 2011-12-22 ENCOUNTER — Encounter (HOSPITAL_COMMUNITY): Payer: Self-pay | Admitting: Anesthesiology

## 2011-12-22 ENCOUNTER — Inpatient Hospital Stay (HOSPITAL_COMMUNITY): Payer: 59 | Admitting: Anesthesiology

## 2011-12-22 ENCOUNTER — Encounter (HOSPITAL_COMMUNITY): Payer: Self-pay | Admitting: *Deleted

## 2011-12-22 LAB — RPR: RPR Ser Ql: NONREACTIVE

## 2011-12-22 LAB — GLUCOSE, CAPILLARY: Glucose-Capillary: 51 mg/dL — ABNORMAL LOW (ref 70–99)

## 2011-12-22 LAB — CBC
Hemoglobin: 13.3 g/dL (ref 12.0–15.0)
MCH: 29.4 pg (ref 26.0–34.0)
MCH: 29.4 pg (ref 26.0–34.0)
MCHC: 34.1 g/dL (ref 30.0–36.0)
MCV: 87 fL (ref 78.0–100.0)
Platelets: 157 10*3/uL (ref 150–400)
RDW: 13.4 % (ref 11.5–15.5)

## 2011-12-22 LAB — TYPE AND SCREEN: ABO/RH(D): O POS

## 2011-12-22 MED ORDER — OXYCODONE-ACETAMINOPHEN 5-325 MG PO TABS: 1.0000 | ORAL_TABLET | ORAL | Status: DC | PRN

## 2011-12-22 MED ORDER — DIPHENHYDRAMINE HCL 50 MG/ML IJ SOLN: 12.5000 mg | INTRAMUSCULAR | Status: DC | PRN

## 2011-12-22 MED ORDER — LACTATED RINGERS IV SOLN: 500.0000 mL | Freq: Once | INTRAVENOUS | Status: DC

## 2011-12-22 MED ORDER — MEDROXYPROGESTERONE ACETATE 150 MG/ML IM SUSP: 150.0000 mg | INTRAMUSCULAR | Status: DC | PRN

## 2011-12-22 MED ORDER — FLEET ENEMA 7-19 GM/118ML RE ENEM: 1.0000 | ENEMA | RECTAL | Status: DC | PRN

## 2011-12-22 MED ORDER — ONDANSETRON HCL 4 MG/2ML IJ SOLN: 4.0000 mg | Freq: Four times a day (QID) | INTRAMUSCULAR | Status: DC | PRN

## 2011-12-22 MED ORDER — ZOLPIDEM TARTRATE 5 MG PO TABS: 5.0000 mg | ORAL_TABLET | Freq: Every evening | ORAL | Status: DC | PRN

## 2011-12-22 MED ORDER — OXYTOCIN 40 UNITS IN LACTATED RINGERS INFUSION - SIMPLE MED
62.5000 mL/h | Freq: Once | INTRAVENOUS | Status: AC
  Administered 2011-12-22: 62.5 mL/h via INTRAVENOUS
  Filled 2011-12-22: qty 1000

## 2011-12-22 MED ORDER — PHENYLEPHRINE 40 MCG/ML (10ML) SYRINGE FOR IV PUSH (FOR BLOOD PRESSURE SUPPORT)
80.0000 ug | PREFILLED_SYRINGE | INTRAVENOUS | Status: DC | PRN
  Filled 2011-12-22: qty 2

## 2011-12-22 MED ORDER — IBUPROFEN 600 MG PO TABS
600.0000 mg | ORAL_TABLET | Freq: Four times a day (QID) | ORAL | Status: DC | PRN
  Administered 2011-12-22: 600 mg via ORAL
  Filled 2011-12-22: qty 1

## 2011-12-22 MED ORDER — ONDANSETRON HCL 4 MG/2ML IJ SOLN: 4.0000 mg | INTRAMUSCULAR | Status: DC | PRN

## 2011-12-22 MED ORDER — WITCH HAZEL-GLYCERIN EX PADS: 1.0000 "application " | MEDICATED_PAD | CUTANEOUS | Status: DC | PRN

## 2011-12-22 MED ORDER — SIMETHICONE 80 MG PO CHEW: 80.0000 mg | CHEWABLE_TABLET | ORAL | Status: DC | PRN

## 2011-12-22 MED ORDER — FLEET ENEMA 7-19 GM/118ML RE ENEM: 1.0000 | ENEMA | Freq: Every day | RECTAL | Status: DC | PRN

## 2011-12-22 MED ORDER — EPHEDRINE 5 MG/ML INJ
10.0000 mg | INTRAVENOUS | Status: DC | PRN
  Filled 2011-12-22: qty 2

## 2011-12-22 MED ORDER — LANOLIN HYDROUS EX OINT: TOPICAL_OINTMENT | CUTANEOUS | Status: DC | PRN

## 2011-12-22 MED ORDER — SENNOSIDES-DOCUSATE SODIUM 8.6-50 MG PO TABS
2.0000 | ORAL_TABLET | Freq: Every day | ORAL | Status: DC
  Administered 2011-12-22 – 2011-12-23 (×2): 2 via ORAL

## 2011-12-22 MED ORDER — DIBUCAINE 1 % RE OINT: 1.0000 "application " | TOPICAL_OINTMENT | RECTAL | Status: DC | PRN

## 2011-12-22 MED ORDER — FENTANYL 2.5 MCG/ML BUPIVACAINE 1/10 % EPIDURAL INFUSION (WH - ANES)
14.0000 mL/h | INTRAMUSCULAR | Status: DC
Start: 2011-12-22 — End: 2011-12-22
  Filled 2011-12-22: qty 60

## 2011-12-22 MED ORDER — OXYTOCIN BOLUS FROM INFUSION
250.0000 mL | Freq: Once | INTRAVENOUS | Status: DC
  Filled 2011-12-22: qty 500

## 2011-12-22 MED ORDER — ONDANSETRON HCL 4 MG PO TABS: 4.0000 mg | ORAL_TABLET | ORAL | Status: DC | PRN

## 2011-12-22 MED ORDER — DIPHENHYDRAMINE HCL 25 MG PO CAPS: 25.0000 mg | ORAL_CAPSULE | Freq: Four times a day (QID) | ORAL | Status: DC | PRN

## 2011-12-22 MED ORDER — EPHEDRINE 5 MG/ML INJ
10.0000 mg | INTRAVENOUS | Status: DC | PRN
  Filled 2011-12-22: qty 2
  Filled 2011-12-22: qty 4

## 2011-12-22 MED ORDER — IBUPROFEN 600 MG PO TABS
600.0000 mg | ORAL_TABLET | Freq: Four times a day (QID) | ORAL | Status: DC
  Administered 2011-12-22 – 2011-12-24 (×9): 600 mg via ORAL
  Filled 2011-12-22: qty 1
  Filled 2011-12-22: qty 6
  Filled 2011-12-22 (×7): qty 1

## 2011-12-22 MED ORDER — PHENYLEPHRINE 40 MCG/ML (10ML) SYRINGE FOR IV PUSH (FOR BLOOD PRESSURE SUPPORT)
80.0000 ug | PREFILLED_SYRINGE | INTRAVENOUS | Status: DC | PRN
  Filled 2011-12-22: qty 5
  Filled 2011-12-22: qty 2

## 2011-12-22 MED ORDER — LIDOCAINE HCL (PF) 1 % IJ SOLN
30.0000 mL | INTRAMUSCULAR | Status: DC | PRN
  Filled 2011-12-22: qty 30

## 2011-12-22 MED ORDER — PRENATAL MULTIVITAMIN CH
1.0000 | ORAL_TABLET | Freq: Every day | ORAL | Status: DC
  Administered 2011-12-22 – 2011-12-24 (×3): 1 via ORAL
  Filled 2011-12-22 (×3): qty 1

## 2011-12-22 MED ORDER — CITRIC ACID-SODIUM CITRATE 334-500 MG/5ML PO SOLN: 30.0000 mL | ORAL | Status: DC | PRN

## 2011-12-22 MED ORDER — BENZOCAINE-MENTHOL 20-0.5 % EX AERO: 1.0000 "application " | INHALATION_SPRAY | CUTANEOUS | Status: DC | PRN

## 2011-12-22 MED ORDER — MEASLES, MUMPS & RUBELLA VAC ~~LOC~~ INJ
0.5000 mL | INJECTION | Freq: Once | SUBCUTANEOUS | Status: DC
  Filled 2011-12-22: qty 0.5

## 2011-12-22 MED ORDER — TETANUS-DIPHTH-ACELL PERTUSSIS 5-2.5-18.5 LF-MCG/0.5 IM SUSP: 0.5000 mL | Freq: Once | INTRAMUSCULAR | Status: DC

## 2011-12-22 MED ORDER — LACTATED RINGERS IV SOLN: 500.0000 mL | INTRAVENOUS | Status: DC | PRN

## 2011-12-22 MED ORDER — BISACODYL 10 MG RE SUPP: 10.0000 mg | Freq: Every day | RECTAL | Status: DC | PRN

## 2011-12-22 MED ORDER — LACTATED RINGERS IV SOLN: INTRAVENOUS | Status: DC

## 2011-12-22 MED ORDER — ACETAMINOPHEN 325 MG PO TABS: 650.0000 mg | ORAL_TABLET | ORAL | Status: DC | PRN

## 2011-12-22 NOTE — H&P (Signed)
31 year old G 2 P 1001 at 44 w 3 days presented in active labor. She progressed rapidly to C/C/+2 and had a very rapid second stage. See my full delivery note. Mother and baby doing well

## 2011-12-22 NOTE — MAU Note (Signed)
Dr. Vincente Poli notified of pt.  Orders rec'd for admission.

## 2011-12-22 NOTE — Progress Notes (Signed)
Post Partum Day 0 Subjective: no complaints, up ad lib, voiding and tolerating PO  Objective: Blood pressure 130/71, pulse 53, temperature 98.2 F (36.8 C), temperature source Oral, resp. rate 18, height 5\' 5"  (1.651 m), weight 63.504 kg (140 lb), SpO2 96.00%, unknown if currently breastfeeding.  Physical Exam:  General: alert and cooperative Lochia: appropriate Uterine Fundus: firm Incision: perineum intact DVT Evaluation: No evidence of DVT seen on physical exam.   Basename 12/22/11 0530 12/22/11 0030  HGB 11.5* 13.3  HCT 34.0* 39.0    Assessment/Plan: Plan for discharge tomorrow   LOS: 1 day   CURTIS,CAROL G 12/22/2011, 8:11 AM

## 2011-12-22 NOTE — Anesthesia Preprocedure Evaluation (Signed)
Anesthesia Evaluation  Patient identified by MRN, date of birth, ID band Patient awake    Reviewed: Allergy & Precautions, H&P , NPO status , Patient's Chart, lab work & pertinent test results  Airway Mallampati: I TM Distance: >3 FB Neck ROM: full    Dental No notable dental hx.    Pulmonary neg pulmonary ROS,    Pulmonary exam normal       Cardiovascular negative cardio ROS      Neuro/Psych negative neurological ROS  negative psych ROS   GI/Hepatic negative GI ROS, Neg liver ROS,   Endo/Other  Gestational  Renal/GU negative Renal ROS  negative genitourinary   Musculoskeletal negative musculoskeletal ROS (+)   Abdominal Normal abdominal exam  (+)   Peds negative pediatric ROS (+)  Hematology negative hematology ROS (+)   Anesthesia Other Findings   Reproductive/Obstetrics (+) Pregnancy                           Anesthesia Physical Anesthesia Plan  ASA: II  Anesthesia Plan: Epidural   Post-op Pain Management:    Induction:   Airway Management Planned:   Additional Equipment:   Intra-op Plan:   Post-operative Plan:   Informed Consent: I have reviewed the patients History and Physical, chart, labs and discussed the procedure including the risks, benefits and alternatives for the proposed anesthesia with the patient or authorized representative who has indicated his/her understanding and acceptance.     Plan Discussed with:   Anesthesia Plan Comments:         Anesthesia Quick Evaluation  

## 2011-12-23 NOTE — Progress Notes (Signed)
Post Partum Day 1 Subjective: no complaints, up ad lib, voiding, tolerating PO, + flatus and baby with projectile vomiting, observation only today  Objective: Blood pressure 93/54, pulse 56, temperature 97.9 F (36.6 C), temperature source Oral, resp. rate 18, height 5\' 5"  (1.651 m), weight 63.504 kg (140 lb), SpO2 96.00%, unknown if currently breastfeeding.  Physical Exam:  General: alert and cooperative Lochia: appropriate Uterine Fundus: firm Incision: perineum intact DVT Evaluation: No evidence of DVT seen on physical exam.   Basename 12/22/11 0530 12/22/11 0030  HGB 11.5* 13.3  HCT 34.0* 39.0    Assessment/Plan: Plan for discharge tomorrow   LOS: 2 days   Zaidan Keeble G 12/23/2011, 8:07 AM

## 2011-12-24 MED ORDER — IBUPROFEN 600 MG PO TABS: 600.0000 mg | ORAL_TABLET | Freq: Four times a day (QID) | ORAL | Status: AC

## 2011-12-24 MED ORDER — PRENATAL MULTIVITAMIN CH: 1.0000 | ORAL_TABLET | Freq: Every day | ORAL | Status: DC

## 2011-12-24 NOTE — Discharge Summary (Signed)
Obstetric Discharge Summary Reason for Admission: onset of labor Prenatal Procedures: ultrasound Intrapartum Procedures: spontaneous vaginal delivery Postpartum Procedures: none Complications-Operative and Postpartum: none Hemoglobin  Date Value Range Status  12/22/2011 11.5* 12.0 - 15.0 g/dL Final     HCT  Date Value Range Status  12/22/2011 34.0* 36.0 - 46.0 % Final    Physical Exam:  General: alert and cooperative Lochia: appropriate Uterine Fundus: firm Incision: perineum intact DVT Evaluation: No evidence of DVT seen on physical exam.  Discharge Diagnoses: Term Pregnancy-delivered  Discharge Information: Date: 12/24/2011 Activity: pelvic rest Diet: routine Medications: PNV and Ibuprofen Condition: stable Instructions: refer to practice specific booklet Discharge to: home   Newborn Data: Live born female  Birth Weight: 6 lb 10.5 oz (3019 g) APGAR: 8, 8  Home with mother.  Dixie Jafri G 12/24/2011, 8:21 AM

## 2012-01-01 NOTE — H&P (Signed)
31 year old G 2 P 1 at 30 w 3 days presented in active labor. PNC See [place] GBBS is neg GDM History of NSVD x 1  Afebrile Vital signs stable General alert and oriented Lung CTAB Car RRR Cervix C/C +2 Vertex  Impression: Active labor  PLAN: Anticipate NSVD

## 2012-11-23 DIAGNOSIS — Z87442 Personal history of urinary calculi: Secondary | ICD-10-CM | POA: Insufficient documentation

## 2013-05-22 ENCOUNTER — Other Ambulatory Visit: Payer: Self-pay | Admitting: Dermatology

## 2013-06-21 ENCOUNTER — Other Ambulatory Visit: Payer: Self-pay | Admitting: Dermatology

## 2013-07-11 ENCOUNTER — Emergency Department (HOSPITAL_COMMUNITY)
Admission: EM | Admit: 2013-07-11 | Discharge: 2013-07-11 | Disposition: A | Discharge status: Home / Self Care | Payer: 59 | Attending: Emergency Medicine | Admitting: Emergency Medicine

## 2013-07-11 ENCOUNTER — Encounter (HOSPITAL_COMMUNITY): Payer: Self-pay | Admitting: Emergency Medicine

## 2013-07-11 ENCOUNTER — Emergency Department (HOSPITAL_COMMUNITY): Payer: 59

## 2013-07-11 DIAGNOSIS — F411 Generalized anxiety disorder: Secondary | ICD-10-CM | POA: Insufficient documentation

## 2013-07-11 DIAGNOSIS — R112 Nausea with vomiting, unspecified: Secondary | ICD-10-CM | POA: Insufficient documentation

## 2013-07-11 DIAGNOSIS — R1011 Right upper quadrant pain: Secondary | ICD-10-CM | POA: Insufficient documentation

## 2013-07-11 DIAGNOSIS — Z9089 Acquired absence of other organs: Secondary | ICD-10-CM | POA: Insufficient documentation

## 2013-07-11 DIAGNOSIS — Z8679 Personal history of other diseases of the circulatory system: Secondary | ICD-10-CM | POA: Insufficient documentation

## 2013-07-11 DIAGNOSIS — R109 Unspecified abdominal pain: Secondary | ICD-10-CM

## 2013-07-11 DIAGNOSIS — Z8632 Personal history of gestational diabetes: Secondary | ICD-10-CM | POA: Insufficient documentation

## 2013-07-11 DIAGNOSIS — R748 Abnormal levels of other serum enzymes: Secondary | ICD-10-CM

## 2013-07-11 DIAGNOSIS — Z7982 Long term (current) use of aspirin: Secondary | ICD-10-CM | POA: Insufficient documentation

## 2013-07-11 DIAGNOSIS — Z3202 Encounter for pregnancy test, result negative: Secondary | ICD-10-CM | POA: Insufficient documentation

## 2013-07-11 LAB — LIPASE, BLOOD: Lipase: 22 U/L (ref 11–59)

## 2013-07-11 LAB — URINALYSIS, ROUTINE W REFLEX MICROSCOPIC
BILIRUBIN URINE: NEGATIVE
GLUCOSE, UA: NEGATIVE mg/dL
Hgb urine dipstick: NEGATIVE
Ketones, ur: NEGATIVE mg/dL
Leukocytes, UA: NEGATIVE
Nitrite: NEGATIVE
Protein, ur: NEGATIVE mg/dL
Specific Gravity, Urine: 1.022 (ref 1.005–1.030)
Urobilinogen, UA: 0.2 mg/dL (ref 0.0–1.0)
pH: 7 (ref 5.0–8.0)

## 2013-07-11 LAB — POC URINE PREG, ED: Preg Test, Ur: NEGATIVE

## 2013-07-11 LAB — COMPREHENSIVE METABOLIC PANEL
ALT: 514 U/L — AB (ref 0–35)
AST: 865 U/L — ABNORMAL HIGH (ref 0–37)
Albumin: 4 g/dL (ref 3.5–5.2)
Alkaline Phosphatase: 74 U/L (ref 39–117)
BUN: 11 mg/dL (ref 6–23)
CO2: 20 meq/L (ref 19–32)
Calcium: 8.8 mg/dL (ref 8.4–10.5)
Chloride: 104 mEq/L (ref 96–112)
Creatinine, Ser: 0.64 mg/dL (ref 0.50–1.10)
GFR calc Af Amer: >90 mL/min (ref >90)
GFR calc non Af Amer: >90 mL/min (ref >90)
Glucose, Bld: 89 mg/dL (ref 70–99)
POTASSIUM: 4.5 meq/L (ref 3.7–5.3)
SODIUM: 140 meq/L (ref 137–147)
TOTAL PROTEIN: 7.1 g/dL (ref 6.0–8.3)
Total Bilirubin: 1 mg/dL (ref 0.3–1.2)

## 2013-07-11 LAB — CBC WITH DIFFERENTIAL/PLATELET
Basophils Absolute: 0 10*3/uL (ref 0.0–0.1)
Basophils Relative: 0 % (ref 0–1)
EOS ABS: 0.2 10*3/uL (ref 0.0–0.7)
Eosinophils Relative: 2 % (ref 0–5)
HCT: 40 % (ref 36.0–46.0)
HEMOGLOBIN: 13.8 g/dL (ref 12.0–15.0)
LYMPHS ABS: 2 10*3/uL (ref 0.7–4.0)
LYMPHS PCT: 24 % (ref 12–46)
MCH: 29.6 pg (ref 26.0–34.0)
MCHC: 34.5 g/dL (ref 30.0–36.0)
MCV: 85.7 fL (ref 78.0–100.0)
MONOS PCT: 7 % (ref 3–12)
Monocytes Absolute: 0.6 10*3/uL (ref 0.1–1.0)
NEUTROS ABS: 5.7 10*3/uL (ref 1.7–7.7)
NEUTROS PCT: 67 % (ref 43–77)
PLATELETS: 173 10*3/uL (ref 150–400)
RBC: 4.67 MIL/uL (ref 3.87–5.11)
RDW: 13.4 % (ref 11.5–15.5)
WBC: 8.5 10*3/uL (ref 4.0–10.5)

## 2013-07-11 LAB — HEPATITIS PANEL, ACUTE
HCV AB: NEGATIVE
HEP B C IGM: NONREACTIVE
HEP B S AG: NEGATIVE
Hep A IgM: NONREACTIVE

## 2013-07-11 MED ORDER — ONDANSETRON HCL 4 MG/2ML IJ SOLN
4.0000 mg | Freq: Once | INTRAMUSCULAR | Status: AC
  Administered 2013-07-11: 4 mg via INTRAVENOUS
  Filled 2013-07-11: qty 2

## 2013-07-11 MED ORDER — IOHEXOL 300 MG/ML  SOLN
100.0000 mL | Freq: Once | INTRAMUSCULAR | Status: AC | PRN
  Administered 2013-07-11: 80 mL via INTRAVENOUS

## 2013-07-11 MED ORDER — HYDROMORPHONE HCL PF 1 MG/ML IJ SOLN
1.0000 mg | Freq: Once | INTRAMUSCULAR | Status: AC
  Administered 2013-07-11: 1 mg via INTRAVENOUS
  Filled 2013-07-11: qty 1

## 2013-07-11 MED ORDER — IOHEXOL 300 MG/ML  SOLN
25.0000 mL | Freq: Once | INTRAMUSCULAR | Status: AC | PRN
  Administered 2013-07-11: 25 mL via ORAL

## 2013-07-11 NOTE — ED Notes (Signed)
Pt presents with RUQ pain radiating to her Right back and vomiting x1 last night. Pt states she is currently see a gastroenterologist due to elevated liver enzymes. Pt states this pain feels similar but more intense.

## 2013-07-11 NOTE — ED Notes (Signed)
Pt reports she has occassional "palpatations" in her chest over the past few months, requesting for this issue to be looked into. Jarrett Soho PA made aware.

## 2013-07-11 NOTE — ED Provider Notes (Signed)
CSN: [id number]     Arrival date & time 07/11/13  3267 History   First MD Initiated Contact with Patient 07/11/13 1110     Chief Complaint  Patient presents with  . Abdominal Pain     (Consider location/radiation/quality/duration/timing/severity/associated sxs/prior Treatment) HPI Comments: Patient is a 33 year old female with history of gestational diabetes, elevated liver enzymes, cholecystectomy who presents today with right upper quadrant pain. She states this began around 1230am and lasted 1 hour. The pain quickly returned this morning and has been constant for the past few hours. It is a squeezing, twisting pain in her abdomen. She has had pain like this in the past, but never this severe. She has had her gallbladder removed for this issue which did not seem to improve her pain. She has "attacks" approximately once a month, but they are generally short lived and resolve with hyoscyamine. Hyoscyamine has not improved her symptoms today. Her last attack was in December. She vomited once last night. She denies fevers, chills, diarrhea, shortness of breath, chest pain.   The history is provided by the patient. No language interpreter was used.    Dr Michaelyn Barter digestive health  Past Medical History  Diagnosis Date  . Gestational diabetes 2013    diet controlled  . Cardiac arrhythmia     PVCs   Past Surgical History  Procedure Laterality Date  . Cholecystectomy     Family History  Problem Relation Age of Onset  . Asthma Other   . Hypertension Other   . Hyperlipidemia Other   . Cancer Other   . COPD Other    History  Substance Use Topics  . Smoking status: Never Smoker   . Smokeless tobacco: Not on file  . Alcohol Use: No   OB History   Grav Para Term Preterm Abortions TAB SAB Ect Mult Living   2 2 2       2      Review of Systems  Constitutional: Negative for fever and chills.  Respiratory: Negative for shortness of breath.   Cardiovascular: Negative for chest  pain.  Gastrointestinal: Positive for nausea, vomiting and abdominal pain. Negative for diarrhea and constipation.  All other systems reviewed and are negative.      Allergies  Review of patient's allergies indicates no known allergies.  Home Medications   Current Outpatient Rx  Name  Route  Sig  Dispense  Refill  . aspirin EC 81 MG tablet   Oral   Take 81 mg by mouth daily.         . hyoscyamine (LEVSIN SL) 0.125 MG SL tablet   Sublingual   Place 0.125 mg under the tongue every 4 (four) hours as needed for cramping.          BP 119/59  Pulse 102  Temp(Src) 98.1 F (36.7 C) (Oral)  Resp 18  SpO2 97%  Breastfeeding? No Physical Exam  Nursing note and vitals reviewed. Constitutional: She is oriented to person, place, and time. She appears well-developed and well-nourished. She does not appear ill. She appears distressed.  Gripping RUQ with fist. In fetal position on bed.   HENT:  Head: Normocephalic and atraumatic.  Right Ear: External ear normal.  Left Ear: External ear normal.  Nose: Nose normal.  Mouth/Throat: Oropharynx is clear and moist.  Eyes: Conjunctivae are normal.  Neck: Normal range of motion.  No nuchal rigidity or meningeal signs  Cardiovascular: Normal rate, regular rhythm and normal heart sounds.  Pulmonary/Chest: Effort normal and breath sounds normal. No stridor. No respiratory distress. She has no wheezes. She has no rales.  Abdominal: Soft. Bowel sounds are normal. She exhibits no distension. There is tenderness in the right upper quadrant. There is no rigidity, no rebound and no guarding.  Musculoskeletal: Normal range of motion.  Neurological: She is alert and oriented to person, place, and time. She has normal strength.  Skin: Skin is warm and dry. She is not diaphoretic. No erythema.  Psychiatric: Her behavior is normal. Her mood appears anxious.    ED Course  Procedures (including critical care time) Labs Review Labs Reviewed   COMPREHENSIVE METABOLIC PANEL - Abnormal; Notable for the following:    AST 865 (*)    ALT 514 (*)    All other components within normal limits  CBC WITH DIFFERENTIAL  LIPASE, BLOOD  URINALYSIS, ROUTINE W REFLEX MICROSCOPIC  HEPATITIS PANEL, ACUTE  POC URINE PREG, ED   Imaging Review US Abdomen Complete  07/11/2013   CLINICAL DATA Cholecystectomy  EXAM ULTRASOUND ABDOMEN COMPLETE  COMPARISON None.  FINDINGS Gallbladder:  Surgically absent.  Common bile duct:  Diameter: 4.9 mm  Liver:  No focal lesion identified. Within normal limits in parenchymal echogenicity.  IVC:  No abnormality visualized.  Pancreas:  Visualized portion unremarkable.  Spleen:  Size and appearance within normal limits.  Right Kidney:  Length: 12.2 cm. Echogenicity within normal limits. No mass or hydronephrosis visualized.  Left Kidney:  Length: 11.6 cm. Echogenicity within normal limits. No mass or hydronephrosis visualized.  Abdominal aorta:  No aneurysm visualized.  Other findings:  None.  IMPRESSION 1. Prior cholecystectomy. 2. Otherwise normal abdominal ultrasound.  SIGNATURE  Electronically Signed   By: Kathreen Devoid   On: 07/11/2013 13:51   Ct Abdomen Pelvis W Contrast  07/11/2013   CLINICAL DATA Right lower quadrant and back pain with vomiting history of elevated hepatic enzymes  EXAM CT ABDOMEN AND PELVIS WITH CONTRAST  TECHNIQUE Multidetector CT imaging of the abdomen and pelvis was performed using the standard protocol following bolus administration of intravenous contrast.  CONTRAST 31mL OMNIPAQUE IOHEXOL 300 MG/ML SOLN intravenously ; the patient also received oral contrast material.  COMPARISON CT ABD W/CM dated 09/16/2007; US ABDOMEN COMPLETE dated 07/11/2013  FINDINGS The liver demonstrates subtle decreased density which suggests fatty infiltration. There is minimal intrahepatic ductal dilation. There is gas within the common bile duct likely from previous sphincterotomy. Some left-sided intrahepatic ductal gas is  present as well. There are surgical clips in the gallbladder fossa.  The pancreas, spleen, adrenal glands, kidneys, and abdominal aorta are normal in appearance. There is no periaortic or pericaval lymphadenopathy. The psoas musculature is normal in density and contour.  The stomach is moderately distended with the orally administered contrast. The duodenum exhibits no evidence of obstruction or inflammation. No ulcer niche is demonstrated. There is contrast present within the jejunum and proximal ileum. There is no evidence of small bowel obstruction or ileus. No contrast is present within the distal small bowel nor within the colon. Incomplete distention of the transverse and descending portions of the colon as well as the rectosigmoid is demonstrated. No pericolonic inflammatory change is evident. There is no significant diverticulosis. The appendix is not discretely demonstrated. No free extraluminal gas collections or inflammatory fluid collections are demonstrated.  The uterus is normal in contour and contains an IUD. No adnexal masses are demonstrated. The partially distended urinary bladder is normal in appearance. The lumbar vertebral bodies are preserved  in height. The bony pelvis exhibits no acute abnormalities. The lung bases are clear.  IMPRESSION 1. Evaluation of the distal small bowel as well as the colon is limited due to the lack of oral contrast and relative nondistention. There is no secondary evidence of acute colitis or enteritis. If there are strong clinical concerns of bowel abnormality, administration of additional oral contrast and rescanning could be considered. 2. There is an IUD within the normal appearing uterus. No adnexal masses or free pelvic fluid are demonstrated. 3. There are post cholecystectomy and sphincterotomy changes within the common bile duct and left hepatic ducts. No hepatic masses are demonstrated. There are fatty infiltrative changes of the liver. 4. There is no acute  urinary tract abnormality.  SIGNATURE  Electronically Signed   By: David  Martinique   On: 07/11/2013 16:08     EKG Interpretation   Date/Time:  Tuesday July 11 2013 14:12:09 EDT Ventricular Rate:  52 PR Interval:  159 QRS Duration: 93 QT Interval:  448 QTC Calculation: 417 R Axis:   86 Text Interpretation:  Sinus rhythm No previous tracing Confirmed by KNAPP   MD-J, JON ([id number]) on 07/11/2013 2:29:24 PM      2:05 PM Discussed case with [facility]. They recommend touching base with Dr. Glennon Hamilton and scheduling outpatient follow up.   2:17 PM Discussed case with Dr. Glennon Hamilton who states that CT is not unreasonable. He will defer ultimate dispo to me as I am the provider seeing the patient. Will obtain hepatitis panel on patient.   MDM   Final diagnoses:  Elevated liver enzymes  Abdominal pain   Patient presents to ED with abdominal pain. Symptoms have resolved after 1mg  of Dilaudid. Patient then remains asymptomatic for the duration of her ED stay. AST is elevated at 865 and ALT is elevated at 514. CT abd shows fatty infiltrate of the liver. No stone in the common bile duct. Post surgical changes seen from prior cholecystectomy. US shows no acute abnormality. Hepatitis panel is negative. I will send this chart to Dr. Glennon Hamilton who will see the patient in his office in the morning. Consultation is appreciated. Patient was given reasons to return to the ED immediately. Vital signs stable for discharge. Discussed this case with Dr. Tomi Bamberger who agrees with plan. Patient / Family / Caregiver informed of clinical course, understand medical decision-making process, and agree with plan.   Elwyn Lade, PA-C 07/11/13 2101

## 2013-07-11 NOTE — ED Notes (Signed)
PT comfortable with discharge and follow up instructions. No prescriptions.

## 2013-07-11 NOTE — Discharge Instructions (Signed)
Abdominal Pain, Adult  Many things can cause belly (abdominal) pain. Most times, the belly pain is not dangerous. Many cases of belly pain can be watched and treated at home.  HOME CARE   · Do not take medicines that help you go poop (laxatives) unless told to by your doctor.  · Only take medicine as told by your doctor.  · Eat or drink as told by your doctor. Your doctor will tell you if you should be on a special diet.  GET HELP IF:  · You do not know what is causing your belly pain.  · You have belly pain while you are sick to your stomach (nauseous) or have runny poop (diarrhea).  · You have pain while you pee or poop.  · Your belly pain wakes you up at night.  · You have belly pain that gets worse or better when you eat.  · You have belly pain that gets worse when you eat fatty foods.  GET HELP RIGHT AWAY IF:   · The pain does not go away within 2 hours.  · You have a fever.  · You keep throwing up (vomiting).  · The pain changes and is only in the right or left part of the belly.  · You have bloody or tarry looking poop.  MAKE SURE YOU:   · Understand these instructions.  · Will watch your condition.  · Will get help right away if you are not doing well or get worse.  Document Released: 10/07/2007 Document Revised: 02/08/2013 Document Reviewed: 12/28/2012  [employer]® Patient Information ©2014 [employer].

## 2013-07-15 NOTE — ED Provider Notes (Signed)
Medical screening examination/treatment/procedure(s) were performed by non-physician practitioner and as supervising physician I was immediately available for consultation/collaboration.   EKG Interpretation   Date/Time:  Tuesday July 11 2013 14:12:09 EDT Ventricular Rate:  52 PR Interval:  159 QRS Duration: 93 QT Interval:  448 QTC Calculation: 417 R Axis:   86 Text Interpretation:  Sinus rhythm No previous tracing Confirmed by Quashawn Jewkes   MD-J, Tanaka Gillen ([id number]) on 07/11/2013 2:29:24 PM        Kathalene Frames, MD 07/15/13 [phone]

## 2014-03-05 ENCOUNTER — Encounter (HOSPITAL_COMMUNITY): Payer: Self-pay | Admitting: Emergency Medicine

## 2014-05-16 LAB — OB RESULTS CONSOLE RUBELLA ANTIBODY, IGM: Rubella: NON-IMMUNE/NOT IMMUNE

## 2014-05-16 LAB — OB RESULTS CONSOLE GC/CHLAMYDIA
Chlamydia: NEGATIVE
Gonorrhea: NEGATIVE

## 2014-05-16 LAB — OB RESULTS CONSOLE ANTIBODY SCREEN: Antibody Screen: NEGATIVE

## 2014-05-16 LAB — OB RESULTS CONSOLE ABO/RH: RH Type: POSITIVE

## 2014-05-16 LAB — OB RESULTS CONSOLE HIV ANTIBODY (ROUTINE TESTING): HIV: NONREACTIVE

## 2014-05-16 LAB — OB RESULTS CONSOLE HEPATITIS B SURFACE ANTIGEN: HEP B S AG: NEGATIVE

## 2014-05-16 LAB — OB RESULTS CONSOLE RPR: RPR: NONREACTIVE

## 2014-05-31 ENCOUNTER — Other Ambulatory Visit: Payer: Self-pay | Admitting: Obstetrics and Gynecology

## 2014-06-01 LAB — CYTOLOGY - PAP

## 2014-07-03 ENCOUNTER — Ambulatory Visit (INDEPENDENT_AMBULATORY_CARE_PROVIDER_SITE_OTHER): Payer: 59 | Admitting: Cardiovascular Disease

## 2014-07-03 ENCOUNTER — Encounter: Payer: Self-pay | Admitting: Cardiovascular Disease

## 2014-07-03 VITALS — BP 130/72 | HR 79 | Ht 65.5 in | Wt 129.5 lb

## 2014-07-03 DIAGNOSIS — I493 Ventricular premature depolarization: Secondary | ICD-10-CM

## 2014-07-03 DIAGNOSIS — R0602 Shortness of breath: Secondary | ICD-10-CM | POA: Insufficient documentation

## 2014-07-03 NOTE — Patient Instructions (Signed)
Your physician has recommended that you wear a 48 holter monitor. Holter monitors are medical devices that record the heart's electrical activity. Doctors most often use these monitors to diagnose arrhythmias. Arrhythmias are problems with the speed or rhythm of the heartbeat. The monitor is a small, portable device. You can wear one while you do your normal daily activities. This is usually used to diagnose what is causing palpitations/syncope (passing out).  Your physician has requested that you have an echocardiogram. Echocardiography is a painless test that uses sound waves to create images of your heart. It provides your doctor with information about the size and shape of your heart and how well your heart's chambers and valves are working. This procedure takes approximately one hour. There are no restrictions for this procedure.  Your physician recommends that you schedule a follow-up appointment as needed with Dr Fletcher Anon.

## 2014-07-03 NOTE — Progress Notes (Signed)
Referring physician: Dr. Louretta Shorten  HPI  This is a pleasant 34 year old female who was referred for evaluation of PVCs. 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 Symptoms resolved without intervention after cutting caffeine intake. This is now her third pregnancy. She did not have any issues or complications during previous pregnancies. However, this time she started having frequent palpitations almost on a daily basis with occasional fast heartbeats. This has been associated with dizziness but no syncope or presyncope. She denies any chest discomfort. However, she has noticed worsening dyspnea which is unusual. There is mild orthopnea but no PND. No lower extremity edema. There is no family history of premature coronary artery disease or sudden death. She is not a smoker and denies any alcohol use at the present time. She does not consume caffeinated products. No recent stress although she does become very anxious when she feels these PVCs.  No Known Allergies   No current outpatient prescriptions on file prior to visit.   No current facility-administered medications on file prior to visit.     Past Medical History  Diagnosis Date  . Gestational diabetes 2013    diet controlled  . Cardiac arrhythmia     PVCs  . PVC (premature ventricular contraction)   . Kidney stones      Past Surgical History  Procedure Laterality Date  . Cholecystectomy       Family History  Problem Relation Age of Onset  . Asthma Other   . Hypertension Other   . Hyperlipidemia Other   . Cancer Other   . COPD Other   . Hypertension Mother   . Thyroid cancer Mother   . Hypertension Father   . Cancer Father   . Hypertension Brother      History   Social History  . Marital Status: Married    Spouse Name: N/A  . Number of Children: N/A  . Years of Education: N/A   Occupational History  . Not on file.   Social  History Main Topics  . Smoking status: Never Smoker   . Smokeless tobacco: Not on file  . Alcohol Use: No  . Drug Use: No  . Sexual Activity: Not Currently   Other Topics Concern  . Not on file   Social History Narrative     ROS A 10 point review of system was performed. It is negative other than that mentioned in the history of present illness.   PHYSICAL EXAM   BP 130/72 mmHg  Pulse 79  Ht 5' 5.5" (1.664 m)  Wt 129 lb 8 oz (58.741 kg)  BMI 21.21 kg/m2 Constitutional: She is oriented to person, place, and time. She appears well-developed and well-nourished. No distress.  HENT: No nasal discharge.  Head: Normocephalic and atraumatic.  Eyes: Pupils are equal and round. No discharge.  Neck: Normal range of motion. Neck supple. No JVD present. No thyromegaly present.  Cardiovascular: Normal rate, regular rhythm, normal heart sounds. Exam reveals no gallop and no friction rub. There is a 1/6 systolic flow murmur at the base of the heart which seems to be physiologic.  Pulmonary/Chest: Effort normal and breath sounds normal. No stridor. No respiratory distress. She has no wheezes. She has no rales. She exhibits no tenderness.  Abdominal: Soft. Bowel sounds are normal. She exhibits no distension. There is no tenderness. There is no rebound and no guarding.  Musculoskeletal: Normal range of motion.  She exhibits no edema and no tenderness.  Neurological: She is alert and oriented to person, place, and time. Coordination normal.  Skin: Skin is warm and dry. No rash noted. She is not diaphoretic. No erythema. No pallor.  Psychiatric: She has a normal mood and affect. Her behavior is normal. Judgment and thought content normal.     EKG: Normal sinus rhythm with sinus arrhythmia. Normal PR and QT intervals.   ASSESSMENT AND PLAN

## 2014-07-03 NOTE — Assessment & Plan Note (Signed)
The patient is having worsening palpitations likely due to known PVCs. I requested a 48-hour older monitor for evaluation. Cardiac physical exam is unremarkable and baseline ECG is normal. The patient was reassured. If the PVC burden is not high, no further intervention is recommended.

## 2014-07-03 NOTE — Assessment & Plan Note (Signed)
She does complain of increased shortness of breath. Thus, I requested an echocardiogram to ensure no structural heart abnormalities.

## 2014-07-06 ENCOUNTER — Encounter: Payer: Self-pay | Admitting: Radiology

## 2014-07-06 ENCOUNTER — Encounter (INDEPENDENT_AMBULATORY_CARE_PROVIDER_SITE_OTHER): Payer: 59

## 2014-07-06 ENCOUNTER — Ambulatory Visit (HOSPITAL_COMMUNITY): Payer: 59 | Attending: Cardiology | Admitting: Radiology

## 2014-07-06 DIAGNOSIS — I493 Ventricular premature depolarization: Secondary | ICD-10-CM

## 2014-07-06 DIAGNOSIS — R0602 Shortness of breath: Secondary | ICD-10-CM | POA: Insufficient documentation

## 2014-07-06 NOTE — Progress Notes (Signed)
Echocardiogram performed.  

## 2014-07-06 NOTE — Progress Notes (Signed)
Patient ID: Melanie Joyce, female   DOB: 07/13/1980, 34 y.o.   MRN: [id number] [employer] 48hr holter applied.

## 2014-07-23 ENCOUNTER — Telehealth: Payer: Self-pay | Admitting: Cardiovascular Disease

## 2014-07-23 NOTE — Telephone Encounter (Signed)
I spoke with the pt and made her aware of Echo and holter monitor results.

## 2014-07-23 NOTE — Telephone Encounter (Signed)
New Msg       Pt states she is returning call about Echo results.    Please return pt call at number provided and ask for her by [facility].   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

## 2014-12-01 ENCOUNTER — Inpatient Hospital Stay (HOSPITAL_COMMUNITY)
Admission: AD | Admit: 2014-12-01 | Discharge: 2014-12-03 | DRG: 775 | Disposition: A | Discharge status: Home / Self Care | Payer: 59 | Source: Ambulatory Visit | Attending: Obstetrics and Gynecology | Admitting: Obstetrics and Gynecology

## 2014-12-01 ENCOUNTER — Encounter (HOSPITAL_COMMUNITY): Payer: Self-pay | Admitting: *Deleted

## 2014-12-01 DIAGNOSIS — Z3A36 36 weeks gestation of pregnancy: Secondary | ICD-10-CM | POA: Diagnosis present

## 2014-12-01 DIAGNOSIS — Z8249 Family history of ischemic heart disease and other diseases of the circulatory system: Secondary | ICD-10-CM | POA: Diagnosis not present

## 2014-12-01 DIAGNOSIS — Z87442 Personal history of urinary calculi: Secondary | ICD-10-CM

## 2014-12-01 LAB — CBC
HCT: 34.3 % — ABNORMAL LOW (ref 36.0–46.0)
HEMATOCRIT: 36.3 % (ref 36.0–46.0)
HEMOGLOBIN: 12.4 g/dL (ref 12.0–15.0)
Hemoglobin: 11.5 g/dL — ABNORMAL LOW (ref 12.0–15.0)
MCH: 29.7 pg (ref 26.0–34.0)
MCH: 29.2 pg (ref 26.0–34.0)
MCHC: 34.2 g/dL (ref 30.0–36.0)
MCHC: 33.5 g/dL (ref 30.0–36.0)
MCV: 87.1 fL (ref 78.0–100.0)
MCV: 87.1 fL (ref 78.0–100.0)
PLATELETS: 153 10*3/uL (ref 150–400)
Platelets: 194 10*3/uL (ref 150–400)
RBC: 4.17 MIL/uL (ref 3.87–5.11)
RBC: 3.94 MIL/uL (ref 3.87–5.11)
RDW: 13.5 % (ref 11.5–15.5)
RDW: 13.4 % (ref 11.5–15.5)
WBC: 10.8 10*3/uL — AB (ref 4.0–10.5)
WBC: 18.2 10*3/uL — ABNORMAL HIGH (ref 4.0–10.5)

## 2014-12-01 LAB — RPR: RPR Ser Ql: NONREACTIVE

## 2014-12-01 LAB — TYPE AND SCREEN
ABO/RH(D): O POS
ANTIBODY SCREEN: NEGATIVE

## 2014-12-01 MED ORDER — IBUPROFEN 600 MG PO TABS
600.0000 mg | ORAL_TABLET | Freq: Four times a day (QID) | ORAL | Status: DC
  Administered 2014-12-01 – 2014-12-02 (×7): 600 mg via ORAL
  Filled 2014-12-01 (×9): qty 1

## 2014-12-01 MED ORDER — BENZOCAINE-MENTHOL 20-0.5 % EX AERO: 1.0000 "application " | INHALATION_SPRAY | CUTANEOUS | Status: DC | PRN

## 2014-12-01 MED ORDER — TETANUS-DIPHTH-ACELL PERTUSSIS 5-2.5-18.5 LF-MCG/0.5 IM SUSP: 0.5000 mL | Freq: Once | INTRAMUSCULAR | Status: DC

## 2014-12-01 MED ORDER — ACETAMINOPHEN 325 MG PO TABS: 650.0000 mg | ORAL_TABLET | ORAL | Status: DC | PRN

## 2014-12-01 MED ORDER — FLEET ENEMA 7-19 GM/118ML RE ENEM: 1.0000 | ENEMA | RECTAL | Status: DC | PRN

## 2014-12-01 MED ORDER — LIDOCAINE HCL (PF) 1 % IJ SOLN
30.0000 mL | INTRAMUSCULAR | Status: DC | PRN
  Filled 2014-12-01: qty 30

## 2014-12-01 MED ORDER — ACETAMINOPHEN 325 MG PO TABS
650.0000 mg | ORAL_TABLET | ORAL | Status: DC | PRN
  Administered 2014-12-01: 650 mg via ORAL
  Filled 2014-12-01: qty 2

## 2014-12-01 MED ORDER — SIMETHICONE 80 MG PO CHEW
80.0000 mg | CHEWABLE_TABLET | ORAL | Status: DC | PRN
Start: 2014-12-01 — End: 2014-12-03

## 2014-12-01 MED ORDER — OXYCODONE-ACETAMINOPHEN 5-325 MG PO TABS: 2.0000 | ORAL_TABLET | ORAL | Status: DC | PRN

## 2014-12-01 MED ORDER — OXYTOCIN BOLUS FROM INFUSION: 500.0000 mL | INTRAVENOUS | Status: DC

## 2014-12-01 MED ORDER — CITRIC ACID-SODIUM CITRATE 334-500 MG/5ML PO SOLN: 30.0000 mL | ORAL | Status: DC | PRN

## 2014-12-01 MED ORDER — DIBUCAINE 1 % RE OINT: 1.0000 "application " | TOPICAL_OINTMENT | RECTAL | Status: DC | PRN

## 2014-12-01 MED ORDER — PRENATAL MULTIVITAMIN CH
1.0000 | ORAL_TABLET | Freq: Every day | ORAL | Status: DC
  Filled 2014-12-01: qty 1

## 2014-12-01 MED ORDER — LACTATED RINGERS IV SOLN: INTRAVENOUS | Status: DC

## 2014-12-01 MED ORDER — ONDANSETRON HCL 4 MG/2ML IJ SOLN
4.0000 mg | INTRAMUSCULAR | Status: DC | PRN
Start: 2014-12-01 — End: 2014-12-03

## 2014-12-01 MED ORDER — OXYCODONE-ACETAMINOPHEN 5-325 MG PO TABS: 1.0000 | ORAL_TABLET | ORAL | Status: DC | PRN

## 2014-12-01 MED ORDER — ZOLPIDEM TARTRATE 5 MG PO TABS: 5.0000 mg | ORAL_TABLET | Freq: Every evening | ORAL | Status: DC | PRN

## 2014-12-01 MED ORDER — ONDANSETRON HCL 4 MG/2ML IJ SOLN: 4.0000 mg | Freq: Four times a day (QID) | INTRAMUSCULAR | Status: DC | PRN

## 2014-12-01 MED ORDER — FLEET ENEMA 7-19 GM/118ML RE ENEM: 1.0000 | ENEMA | Freq: Every day | RECTAL | Status: DC | PRN

## 2014-12-01 MED ORDER — DIPHENHYDRAMINE HCL 25 MG PO CAPS: 25.0000 mg | ORAL_CAPSULE | Freq: Four times a day (QID) | ORAL | Status: DC | PRN

## 2014-12-01 MED ORDER — LANOLIN HYDROUS EX OINT: TOPICAL_OINTMENT | CUTANEOUS | Status: DC | PRN

## 2014-12-01 MED ORDER — ONDANSETRON HCL 4 MG PO TABS: 4.0000 mg | ORAL_TABLET | ORAL | Status: DC | PRN

## 2014-12-01 MED ORDER — URSODIOL 300 MG PO CAPS
300.0000 mg | ORAL_CAPSULE | Freq: Two times a day (BID) | ORAL | Status: DC
  Administered 2014-12-01 – 2014-12-03 (×3): 300 mg via ORAL
  Filled 2014-12-01 (×5): qty 1

## 2014-12-01 MED ORDER — LACTATED RINGERS IV SOLN: 500.0000 mL | INTRAVENOUS | Status: DC | PRN

## 2014-12-01 MED ORDER — SENNOSIDES-DOCUSATE SODIUM 8.6-50 MG PO TABS
2.0000 | ORAL_TABLET | ORAL | Status: DC
  Administered 2014-12-01 – 2014-12-02 (×2): 2 via ORAL
  Filled 2014-12-01 (×2): qty 2

## 2014-12-01 MED ORDER — WITCH HAZEL-GLYCERIN EX PADS: 1.0000 "application " | MEDICATED_PAD | CUTANEOUS | Status: DC | PRN

## 2014-12-01 MED ORDER — BISACODYL 10 MG RE SUPP: 10.0000 mg | Freq: Every day | RECTAL | Status: DC | PRN

## 2014-12-01 MED ORDER — OXYTOCIN 40 UNITS IN LACTATED RINGERS INFUSION - SIMPLE MED
INTRAVENOUS | Status: AC
  Filled 2014-12-01: qty 1000

## 2014-12-01 MED ORDER — OXYTOCIN 40 UNITS IN LACTATED RINGERS INFUSION - SIMPLE MED: 62.5000 mL/h | INTRAVENOUS | Status: DC

## 2014-12-01 MED ORDER — LIDOCAINE HCL (PF) 1 % IJ SOLN
INTRAMUSCULAR | Status: AC
  Filled 2014-12-01: qty 30

## 2014-12-01 NOTE — Progress Notes (Signed)
Post Partum Day DOD Subjective: no complaints  Objective: Blood pressure 114/57, pulse 71, temperature 98.1 F (36.7 C), temperature source Oral, resp. rate 18, height 5\' 5"  (1.651 m), weight 148 lb (67.132 kg), unknown if currently breastfeeding.  Physical Exam:  General: alert, cooperative and no distress Lochia: appropriate Uterine Fundus: firm Incision: healing well DVT Evaluation: No evidence of DVT seen on physical exam.   Recent Labs  12/01/14 0210 12/01/14 0630  HGB 12.4 11.5*  HCT 36.3 34.3*    Assessment/Plan: Plan for discharge tomorrow   LOS: 0 days   Ysenia Filice II,Olimpia Tinch E 12/01/2014, 7:37 AM

## 2014-12-01 NOTE — Progress Notes (Signed)
Dr Gaetano Net in MAU for delivery. Made aware of pt taken to 169 and her labor status with SROM about 0100 with pink fld

## 2014-12-01 NOTE — MAU Note (Signed)
Pt came in very uncomfortable at 0145. States water broke about 26mins before and she delivered her first 2 quickly - last one in 2 hours. Pt helped to bed and leaking a lot of pink fld. States baby has not moved as much since SROM. EFm applied. Cervix checked and Hinton Dyer in [facility] called with report. DR Gaetano Net called and mess left on ans mach as to pt's admission and status. Pt to 169 via stretcher at [place]

## 2014-12-01 NOTE — Progress Notes (Signed)
abd soft to palpation

## 2014-12-01 NOTE — H&P (Signed)
Melanie Joyce is a 34 y.o. female presenting for UCs. Maternal Medical History:  Reason for admission: Contractions.   Contractions: Onset was 3-5 hours ago.    Fetal activity: Perceived fetal activity is normal.      OB History    Gravida Para Term Preterm AB TAB SAB Ectopic Multiple Living   3 2 2       2      Past Medical History  Diagnosis Date  . Gestational diabetes 2013    diet controlled  . Cardiac arrhythmia     PVCs  . PVC (premature ventricular contraction)   . Kidney stones    Past Surgical History  Procedure Laterality Date  . Cholecystectomy     Family History: family history includes Asthma in her other; COPD in her other; Cancer in her father and other; Hyperlipidemia in her other; Hypertension in her brother, father, mother, and other; Thyroid cancer in her mother. Social History:  reports that she has never smoked. She does not have any smokeless tobacco history on file. She reports that she does not drink alcohol or use illicit drugs.   Prenatal Transfer Tool  Maternal Diabetes: No Genetic Screening: Normal Maternal Ultrasounds/Referrals: Normal Fetal Ultrasounds or other Referrals:  None Maternal Substance Abuse:  No Significant Maternal Medications:  None Significant Maternal Lab Results:  None Other Comments:  None  Review of Systems  Eyes: Negative for blurred vision.  Gastrointestinal: Negative for abdominal pain.  Neurological: Negative for headaches.    Dilation: Lip/rim Effacement (%): 100 Station: +1 Exam by:: Production manager Blood pressure 117/63, height 5\' 5"  (1.651 m), weight 148 lb (67.132 kg). Maternal Exam:  Uterine Assessment: Contraction strength is firm.  Contraction frequency is regular.   Abdomen: Fetal presentation: vertex     Fetal Exam Fetal State Assessment: Category I - tracings are normal.     Physical Exam  Cardiovascular: Normal rate.   Respiratory: Effort normal.  GI: Soft.    C&P  Prenatal  labs: ABO, Rh: O/Positive/-- (01/13 0000) Antibody: Negative (01/13 0000) Rubella: Nonimmune (01/13 0000) RPR: Nonreactive (01/13 0000)  HBsAg: Negative (01/13 0000)  HIV: Non-reactive (01/13 0000)  GBS:     Assessment/Plan: 34 yo G3P2 @ 36 1/7 weeks in active labor Anticipate vaginal delivery   Cyril Railey II,Zeev Deakins E 12/01/2014, 2:42 AM

## 2014-12-01 NOTE — Progress Notes (Signed)
Delivery Note At 2:32 AM a viable female was delivered via Vaginal, Spontaneous Delivery (PresentationOA: ;  ).  APGAR:9 ,9 ; weight pending  .  Rapid second stage. Placenta status: , .  Cord:  with the following complications: .  Cord pH: pending  Anesthesia:  none Episiotomy:  none Lacerations: none  Suture Repair: none Est. Blood Loss (mL):    Mom to postpartum.  Baby to Couplet care / Skin to Skin.  Jakki Doughty II,Amunique Neyra E 12/01/2014, 2:44 AM

## 2014-12-02 NOTE — Progress Notes (Signed)
Post Partum Day 1 Subjective: no complaints, up ad lib, voiding, tolerating PO and + flatus  Objective: Blood pressure 116/43, pulse 54, temperature 98 F (36.7 C), temperature source Oral, resp. rate 20, height 5\' 5"  (1.651 m), weight 148 lb (67.132 kg), SpO2 97 %, unknown if currently breastfeeding.  Physical Exam:  General: alert, cooperative and no distress Lochia: appropriate Uterine Fundus: firm Incision: healing well DVT Evaluation: No evidence of DVT seen on physical exam.   Recent Labs  12/01/14 0210 12/01/14 0630  HGB 12.4 11.5*  HCT 36.3 34.3*    Assessment/Plan: Plan for discharge tomorrow   LOS: 1 day   Deavin Forst II,Sherlock Nancarrow E 12/02/2014, 8:53 AM

## 2014-12-03 NOTE — Discharge Summary (Signed)
Obstetric Discharge Summary Reason for Admission: onset of labor Prenatal Procedures: none Intrapartum Procedures: spontaneous vaginal delivery Postpartum Procedures: none Complications-Operative and Postpartum: none HEMOGLOBIN  Date Value Ref Range Status  12/01/2014 11.5* 12.0 - 15.0 g/dL Final   HCT  Date Value Ref Range Status  12/01/2014 34.3* 36.0 - 46.0 % Final    Physical Exam:  General: alert Lochia: appropriate Uterine Fundus: firm Incision: na DVT Evaluation: No evidence of DVT seen on physical exam.  Discharge Diagnoses: Term Pregnancy-delivered  Discharge Information: Date: 12/03/2014 Activity: pelvic rest Diet: routine Medications: PNV and Ibuprofen Condition: stable Instructions: refer to practice specific booklet Discharge to: home   Newborn Data: Live born female  Birth Weight: 5 lb 6.8 oz (2460 g) APGAR: 9, 9  Home with mother.  Ineta Sinning S 12/03/2014, 8:17 AM

## 2014-12-03 NOTE — Progress Notes (Signed)
Pharmacy Clarification:  Asked to verify Ursodiol dosing with patient.  Spoke to patient who stated that during pregnancy she and MD had agreed that she would take 300 mg po daily with plans to increase back to BID after delivery.  Thanks, Antionette Char PharmD BCPS 12/03/2014 9:54 AM

## 2014-12-03 NOTE — Progress Notes (Signed)
Patient rubella non immune. Patient states she prefers to receive vaccine at her workplace. Melanie Joyce, Melanie Joyce

## 2015-01-10 ENCOUNTER — Other Ambulatory Visit: Payer: Self-pay | Admitting: Obstetrics and Gynecology

## 2015-01-11 LAB — CYTOLOGY - PAP

## 2018-05-01 ENCOUNTER — Emergency Department (HOSPITAL_COMMUNITY): Payer: 59

## 2018-05-01 ENCOUNTER — Emergency Department (HOSPITAL_COMMUNITY)
Admission: EM | Admit: 2018-05-01 | Discharge: 2018-05-01 | Discharge status: Against Medical Advice | Payer: 59 | Attending: Emergency Medicine | Admitting: Emergency Medicine

## 2018-05-01 ENCOUNTER — Other Ambulatory Visit: Payer: Self-pay

## 2018-05-01 DIAGNOSIS — R0789 Other chest pain: Secondary | ICD-10-CM | POA: Diagnosis present

## 2018-05-01 DIAGNOSIS — Z5321 Procedure and treatment not carried out due to patient leaving prior to being seen by health care provider: Secondary | ICD-10-CM | POA: Diagnosis not present

## 2018-05-01 LAB — CBC
HCT: 41.2 % (ref 36.0–46.0)
HEMOGLOBIN: 13.9 g/dL (ref 12.0–15.0)
MCH: 29.6 pg (ref 26.0–34.0)
MCHC: 33.7 g/dL (ref 30.0–36.0)
MCV: 87.8 fL (ref 80.0–100.0)
Platelets: 257 10*3/uL (ref 150–400)
RBC: 4.69 MIL/uL (ref 3.87–5.11)
RDW: 12.4 % (ref 11.5–15.5)
WBC: 8.5 10*3/uL (ref 4.0–10.5)
nRBC: 0 % (ref 0.0–0.2)

## 2018-05-01 LAB — BASIC METABOLIC PANEL
ANION GAP: 9 (ref 5–15)
BUN: 13 mg/dL (ref 6–20)
CALCIUM: 9 mg/dL (ref 8.9–10.3)
CO2: 23 mmol/L (ref 22–32)
Chloride: 106 mmol/L (ref 98–111)
Creatinine, Ser: 0.81 mg/dL (ref 0.44–1.00)
GFR calc Af Amer: >60 mL/min (ref >60)
GFR calc non Af Amer: >60 mL/min (ref >60)
Glucose, Bld: 137 mg/dL — ABNORMAL HIGH (ref 70–99)
POTASSIUM: 3.3 mmol/L — AB (ref 3.5–5.1)
SODIUM: 138 mmol/L (ref 135–145)

## 2018-05-01 LAB — I-STAT TROPONIN, ED: Troponin i, poc: 0 ng/mL (ref 0.00–0.08)

## 2018-05-01 LAB — I-STAT BETA HCG BLOOD, ED (MC, WL, AP ONLY): I-stat hCG, quantitative: <5 m[IU]/mL (ref <5)

## 2018-05-01 NOTE — ED Triage Notes (Signed)
Patient c/o CP that radiates to left shoulder and arm; now c/o left arm numbness. Denies N/V. Patient is tearful and mildly panicky in triage.

## 2018-05-01 NOTE — ED Notes (Signed)
Patient requesting to speak to a nurse because she states that she is now having CP again, but on the right side. Repeat EKG ordered for review.

## 2018-05-01 NOTE — ED Notes (Signed)
Patient asked to come back for a repeat EKG and stated "Fuck no, I am leaving and going to [place] because they have no wait".

## 2019-05-30 ENCOUNTER — Other Ambulatory Visit: Payer: Self-pay

## 2019-05-30 ENCOUNTER — Encounter: Payer: Self-pay | Admitting: Allergy and Immunology

## 2019-05-30 ENCOUNTER — Other Ambulatory Visit: Payer: Self-pay | Admitting: Allergy and Immunology

## 2019-05-30 ENCOUNTER — Ambulatory Visit: Payer: 59 | Admitting: Allergy and Immunology

## 2019-05-30 VITALS — BP 130/74 | HR 66 | Temp 97.8°F | Resp 18 | Ht 65.2 in | Wt 132.8 lb

## 2019-05-30 DIAGNOSIS — T781XXA Other adverse food reactions, not elsewhere classified, initial encounter: Secondary | ICD-10-CM

## 2019-05-30 DIAGNOSIS — J3089 Other allergic rhinitis: Secondary | ICD-10-CM | POA: Diagnosis not present

## 2019-05-30 DIAGNOSIS — H1010 Acute atopic conjunctivitis, unspecified eye: Secondary | ICD-10-CM | POA: Insufficient documentation

## 2019-05-30 DIAGNOSIS — H1013 Acute atopic conjunctivitis, bilateral: Secondary | ICD-10-CM

## 2019-05-30 MED ORDER — AZELASTINE-FLUTICASONE 137-50 MCG/ACT NA SUSP: 1.0000 | Freq: Two times a day (BID) | NASAL | 5 refills | Status: DC | PRN

## 2019-05-30 MED ORDER — LEVOCETIRIZINE DIHYDROCHLORIDE 5 MG PO TABS: 5.0000 mg | ORAL_TABLET | Freq: Every evening | ORAL | 5 refills | Status: AC

## 2019-05-30 MED ORDER — OLOPATADINE HCL 0.2 % OP SOLN: 1.0000 [drp] | Freq: Every day | OPHTHALMIC | 5 refills | Status: AC | PRN

## 2019-05-30 NOTE — Progress Notes (Signed)
New Patient Note  RE: Melanie Joyce MRN: [id number] DOB: 03/29/1981 Date of Office Visit: 05/30/2019  Referring provider: No ref. provider found Primary care provider: Chesley Noon, MD  Chief Complaint: Allergic Rhinitis , Conjunctivitis, and Food Intolerance   History of present illness: Melanie Joyce is a 39 y.o. female presenting today for evaluation of rhinoconjunctivitis.  She complains of nasal congestion, sneezing, ocular pruritus, lacrimation, periorbital edema, and "miserable" sinus pressure over the cheekbones.  The symptoms occur primarily between April and June and have progressed year after year over the past several years.  She has tried multiple over-the-counter allergy medications without adequate symptom relief, she states "it is like taking a placebo." She reports that several years ago she began to notice it when she consumed plums she would experience mild lingular pruritus.  She did not experience angioedema, cardiopulmonary symptoms, or GI symptoms.  Eventually, she stopped consuming plums as a result of the oral pruritus.  Assessment and plan: Seasonal and perennial allergic rhinitis  Aeroallergen avoidance measures have been discussed and provided in written form.  A prescription has been provided for levocetirizine(Xyzal), 5 mg daily as needed.  A prescription has been provided for azelastine/fluticasone nasal spray, 1-2 sprays per nostril 2 times daily as needed. Proper nasal spray technique has been discussed and demonstrated.   Nasal saline spray (i.e., Simply Saline) or nasal saline lavage (i.e., NeilMed) is recommended as needed and prior to medicated nasal sprays.  The risks and benefits of aeroallergen immunotherapy have been discussed. The patient is motivated to initiate immunotherapy if insurance coverage is favorable. She will let us know how she would like to proceed.  Allergic conjunctivitis  Treatment plan as outlined above for  allergic rhinitis.  A prescription has been provided for Pataday, one drop per eye daily as needed.  I have also recommended eye lubricant drops (i.e., Natural Tears) as needed.  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 Peeling or cooking the food has shown to reduce symptoms and antihistamines may also relieve symptoms. Immunotherapy to the cross reacting pollens has improved or cured OAS in many patients, though this has not been consistent for all patients. Typically OAS is limited to itching or swelling of mucosal tissues from the lips to the back of the throat.   Information about OAS has been discussed and provided in written form.  All foods causing symptoms are to be avoided.  Should symptoms progress beyond the mouth and throat, 911 is to be called immediately.   Meds ordered this encounter  Medications  . levocetirizine (XYZAL) 5 MG tablet    Sig: Take 1 tablet (5 mg total) by mouth every evening.    Dispense:  30 tablet    Refill:  5  . Azelastine-Fluticasone 137-50 MCG/ACT SUSP    Sig: Place 1-2 sprays into the nose 2 (two) times daily as needed.    Dispense:  23 g    Refill:  5  . Olopatadine HCl (PATADAY) 0.2 % SOLN    Sig: Place 1 drop into both eyes daily as needed.    Dispense:  2.5 mL    Refill:  5    Diagnostics: Epicutaneous testing: Robust reactivity to grass pollen and tree pollen.  Positive to cockroach antigen, dog epithelia, and dust mite antigen. Intradermal testing: Positive to ragweed mix, weed mix, and cat hair.  Equivocal to mold.  Physical examination: Blood pressure 130/74, pulse 66, temperature 97.8 F (36.6 C), temperature source Temporal, resp. rate 18, height 5' 5.2" (1.656 m), weight 132 lb 12.8 oz (60.2 kg), SpO2 98 %, unknown if currently breastfeeding.  General: Alert, interactive, in no acute distress. HEENT: TMs pearly gray, turbinates moderately edematous  with clear discharge, post-pharynx moderately erythematous. Neck: Supple without lymphadenopathy. Lungs: Clear to auscultation without wheezing, rhonchi or rales. CV: Normal S1, S2 without murmurs. Abdomen: Nondistended, nontender. Skin: Warm and dry, without lesions or rashes. Extremities:  No clubbing, cyanosis or edema. Neuro:   Grossly intact.  Review of systems:  Review of systems negative except as noted in HPI / PMHx or noted below: Review of Systems  Constitutional: Negative.   HENT: Negative.   Eyes: Negative.   Respiratory: Negative.   Cardiovascular: Negative.   Gastrointestinal: Negative.   Genitourinary: Negative.   Musculoskeletal: Negative.   Skin: Negative.   Neurological: Negative.   Endo/Heme/Allergies: Negative.   Psychiatric/Behavioral: Negative.     Past medical history:  Past Medical History:  Diagnosis Date  . Cardiac arrhythmia    PVCs  . Gestational diabetes 2013   diet controlled  . Kidney stones   . PVC (premature ventricular contraction)     Past surgical history:  Past Surgical History:  Procedure Laterality Date  . CHOLECYSTECTOMY      Family history: Family History  Problem Relation Age of Onset  . Asthma Other   . Hypertension Other   . Hyperlipidemia Other   . Cancer Other   . COPD Other   . Hypertension Mother   . Thyroid cancer Mother   . Asthma Mother   . Allergic rhinitis Mother   . Hypertension Father   . Cancer Father   . Hypertension Brother   . Allergic rhinitis Brother   . Urticaria Neg Hx   . Eczema Neg Hx     Social history: Social History   Socioeconomic History  . Marital status: Married    Spouse name: Not on file  . Number of children: Not on file  . Years of education: Not on file  . Highest education level: Not on file  Occupational History  . Not on file  Tobacco Use  . Smoking status: Never Smoker  . Smokeless tobacco: Never Used  Substance and Sexual Activity  . Alcohol use: No  . Drug  use: No  . Sexual activity: Not Currently  Other Topics Concern  . Not on file  Social History Narrative  . Not on file   Social Determinants of Health   Financial Resource Strain:   . Difficulty of Paying Living Expenses: Not on file  Food Insecurity:   . Worried About Charity fundraiser in the Last Year: Not on file  . Ran Out of Food in the Last Year: Not on file  Transportation Needs:   . Lack of Transportation (Medical): Not on file  . Lack of Transportation (Non-Medical): Not on file  Physical Activity:   . Days of Exercise per Week: Not on file  . Minutes of Exercise per Session: Not on file  Stress:   . Feeling of Stress : Not on file  Social Connections:   . Frequency of Communication with Friends and Family: Not on file  . Frequency of Social Gatherings with Friends and Family: Not on file  . Attends Religious Services: Not on file  . Active Member of Clubs or Organizations: Not on file  . Attends Club  or Organization Meetings: Not on file  . Marital Status: Not on file  Intimate Partner Violence:   . Fear of Current or Ex-Partner: Not on file  . Emotionally Abused: Not on file  . Physically Abused: Not on file  . Sexually Abused: Not on file    Environmental History: The patient lives in a house built in [place] with hardwood floors throughout, [place], and central air.  There is no known mold/water damage in the home.  There are 2 dogs and 2 cats in the home which do not have access to her bedroom.  She is a non-smoker.  Current Outpatient Medications  Medication Sig Dispense Refill  . FLUoxetine (PROZAC) 20 MG tablet Take 20 mg by mouth daily.    Marland Kitchen spironolactone (ALDACTONE) 100 MG tablet Take 100 mg by mouth daily.    . ursodiol (ACTIGALL) 300 MG capsule Take 300 mg by mouth daily.     . vitamin C (ASCORBIC ACID) 250 MG tablet Take 250 mg by mouth daily.    . Azelastine-Fluticasone 137-50 MCG/ACT SUSP Place 1-2 sprays into the nose 2 (two) times daily as needed.  23 g 5  . levocetirizine (XYZAL) 5 MG tablet Take 1 tablet (5 mg total) by mouth every evening. 30 tablet 5  . Olopatadine HCl (PATADAY) 0.2 % SOLN Place 1 drop into both eyes daily as needed. 2.5 mL 5   No current facility-administered medications for this visit.    Known medication allergies: No Known Allergies  I appreciate the opportunity to take part in [place] care. Please do not hesitate to contact me with questions.  Sincerely,   R. Edgar Frisk, MD

## 2019-05-30 NOTE — Patient Instructions (Addendum)
Seasonal and perennial allergic rhinitis  Aeroallergen avoidance measures have been discussed and provided in written form.  A prescription has been provided for levocetirizine(Xyzal), 5 mg daily as needed.  A prescription has been provided for azelastine/fluticasone nasal spray, 1-2 sprays per nostril 2 times daily as needed. Proper nasal spray technique has been discussed and demonstrated.   Nasal saline spray (i.e., Simply Saline) or nasal saline lavage (i.e., NeilMed) is recommended as needed and prior to medicated nasal sprays.  The risks and benefits of aeroallergen immunotherapy have been discussed. The patient is motivated to initiate immunotherapy if insurance coverage is favorable. She will let us know how she would like to proceed.  Allergic conjunctivitis  Treatment plan as outlined above for allergic rhinitis.  A prescription has been provided for Pataday, one drop per eye daily as needed.  I have also recommended eye lubricant drops (i.e., Natural Tears) as needed.  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 Peeling or cooking the food has shown to reduce symptoms and antihistamines may also relieve symptoms. Immunotherapy to the cross reacting pollens has improved or cured OAS in many patients, though this has not been consistent for all patients. Typically OAS is limited to itching or swelling of mucosal tissues from the lips to the back of the throat.   Information about OAS has been discussed and provided in written form.  All foods causing symptoms are to be avoided.  Should symptoms progress beyond the mouth and throat, 911 is to be called immediately.   Return in about 3 months (around 08/28/2019), or if symptoms worsen or fail to improve.  Reducing Pollen Exposure  The American Academy of Allergy, Asthma and Immunology suggests the following steps to reduce your exposure to pollen during  allergy seasons.    1. Do not hang sheets or clothing out to dry; pollen may collect on these items. 2. Do not mow lawns or spend time around freshly cut grass; mowing stirs up pollen. 3. Keep windows closed at night.  Keep car windows closed while driving. 4. Minimize morning activities outdoors, a time when pollen counts are usually at their highest. 5. Stay indoors as much as possible when pollen counts or humidity is high and on windy days when pollen tends to remain in the air longer. 6. Use air conditioning when possible.  Many air conditioners have filters that trap the pollen spores. 7. Use a HEPA room air filter to remove pollen form the indoor air you breathe.  Control of House Dust Mite Allergen  House dust mites play a major role in allergic asthma and rhinitis.  They occur in environments with high humidity wherever human skin, the food for dust mites is found. High levels have been detected in dust obtained from mattresses, pillows, carpets, upholstered furniture, bed covers, clothes and soft toys.  The principal allergen of the house dust mite is found in its feces.  A gram of dust may contain 1,000 mites and 250,000 fecal particles.  Mite antigen is easily measured in the air during house cleaning activities.    1. Encase mattresses, including the box spring, and pillow, in an air tight cover.  Seal the zipper end of the encased mattresses with wide adhesive tape. 2. Wash the bedding in water of 130 degrees Farenheit weekly.  Avoid cotton comforters/quilts and flannel bedding: the most ideal bed covering is the dacron comforter. 3. Remove all upholstered furniture from the  bedroom. 4. Remove carpets, carpet padding, rugs, and non-washable window drapes from the bedroom.  Wash drapes weekly or use plastic window coverings. 5. Remove all non-washable stuffed toys from the bedroom.  Wash stuffed toys weekly. 6. Have the room cleaned frequently with a vacuum cleaner and a damp  dust-mop.  The patient should not be in a room which is being cleaned and should wait 1 hour after cleaning before going into the room. 7. Close and seal all heating outlets in the bedroom.  Otherwise, the room will become filled with dust-laden air.  An electric heater can be used to heat the room. 8. Reduce indoor humidity to less than 50%.  Do not use a humidifier.  Control of Dog or Cat Allergen  Avoidance is the best way to manage a dog or cat allergy. If you have a dog or cat and are allergic to dog or cats, consider removing the dog or cat from the home. If you have a dog or cat but don't want to find it a new home, or if your family wants a pet even though someone in the household is allergic, here are some strategies that may help keep symptoms at bay:  1. Keep the pet out of your bedroom and restrict it to only a few rooms. Be advised that keeping the dog or cat in only one room will not limit the allergens to that room. 2. Don't pet, hug or kiss the dog or cat; if you do, wash your hands with soap and water. 3. High-efficiency particulate air (HEPA) cleaners run continuously in a bedroom or living room can reduce allergen levels over time. 4. Place electrostatic material sheet in the air inlet vent in the bedroom. 5. Regular use of a high-efficiency vacuum cleaner or a central vacuum can reduce allergen levels. 6. Giving your dog or cat a bath at least once a week can reduce airborne allergen.  Control of Cockroach Allergen  Cockroach allergen has been identified as an important cause of acute attacks of asthma, especially in urban settings.  There are fifty-five species of cockroach that exist in the [place], however only three, the [place], [place] species produce allergen that can affect patients with Asthma.  Allergens can be obtained from fecal particles, egg casings and secretions from cockroaches.    1. Remove food sources. 2. Reduce access to water. 3. Seal  access and entry points. 4. Spray runways with 0.5-1% Diazinon or Chlorpyrifos 5. Blow boric acid power under stoves and refrigerator. 6. Place bait stations (hydramethylnon) at feeding sites.    Oral Allergy Syndrome (OAS)  Oral Allergy Syndrome or OAS is an allergic reaction to certain (usually fresh) fruits, nuts, and vegetables. The allergy is not actually an allergy to food but a syndrome that develops in pollen allergy sufferers. The immune system mistakes the food proteins for the pollen proteins and causes an allergic reaction. For instance, an allergy to ragweed is associated with OAS reactions to banana, watermelon, cantaloupe, honeydew, zucchini, and cucumber. This does not mean that all sufferers of an allergy to ragweed will experience adverse effects from all or even any of these foods. Reactions may begin with one type of food and with reactions to others developing later. However, reaction to one or more foods in any given category does not necessarily mean a person is allergic to all foods in that group. OAS sufferers may have a number of reactions that usually occur very rapidly, within minutes of  eating a trigger food. The most common reaction is an itching or burning sensation in the lips, mouth, and/or pharynx. Sometimes other reactions can be triggered in the eyes, nose, and skin. The most severe reactions can result in asthma problems or anaphylaxis.  If a sufferer is able to swallow the food, there is a good chance that there will be a reaction later in the gastrointestinal tract. Vomiting, diarrhea, severe indigestion, or cramps may occur.  Treatment: An OAS sufferer should avoid foods to which they are allergic. Peeling or cooking the food has shown to reduce symptoms in the throat and mouth, but may not relieve symptoms in the gastrointestinal tract. Antihistamines may also relieve the symptoms of the allergy. Persons with severe reactions may consider carrying injectable  epinephrine should systemic symptoms occur. Allergy immunotherapy to the pollens has improved or cured OAS in many patients, though this has not been consistent for all patients. Dione Housekeeper pollen: almonds, apples, celery, cherries, hazel nuts, peaches, pears, parsley, strawberry, raspberry . Birch pollen: almonds, apples, apricots, avocados, bananas, carrots, celery, cherries, chicory, coriander, fennel, fig, hazel nuts, kiwifruit, nectarines, parsley, parsnips, peaches, pears, peppers, plums, potatoes, prunes, soy, strawberries, wheat; Potential: walnuts . Grass pollen: fig, melons, tomatoes, oranges . Mugwort pollen : carrots, celery, coriander, fennel, parsley, peppers, sunflower . Ragweed pollen : banana, cantaloupe, cucumber, green pepper, paprika, sunflower seeds/oil, honeydew, watermelon, zucchini, echinacea, artichoke, dandelions, honey (if bees pollinate from wild flowers), hibiscus or chamomile tea . Possible cross-reactions (to any of the above): berries (strawberries, blueberries, raspberries, etc), citrus (oranges, lemons, etc), grapes, mango, figs, peanut, pineapple, pomegranates, watermelon

## 2019-05-30 NOTE — Assessment & Plan Note (Signed)
   Treatment plan as outlined above for allergic rhinitis.  A prescription has been provided for Pataday, one drop per eye daily as needed.  I have also recommended eye lubricant drops (i.e., Natural Tears) as needed. 

## 2019-05-30 NOTE — Assessment & Plan Note (Addendum)
   Aeroallergen avoidance measures have been discussed and provided in written form.  A prescription has been provided for levocetirizine(Xyzal), 5 mg daily as needed.  A prescription has been provided for azelastine/fluticasone nasal spray, 1-2 sprays per nostril 2 times daily as needed. Proper nasal spray technique has been discussed and demonstrated.   Nasal saline spray (i.e., Simply Saline) or nasal saline lavage (i.e., NeilMed) is recommended as needed and prior to medicated nasal sprays.  The risks and benefits of aeroallergen immunotherapy have been discussed. The patient is motivated to initiate immunotherapy if insurance coverage is favorable. She will let us know how she would like to proceed.

## 2019-05-30 NOTE — Assessment & Plan Note (Addendum)
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 Peeling or cooking the food has shown to reduce symptoms and antihistamines may also relieve symptoms. Immunotherapy to the cross reacting pollens has improved or cured OAS in many patients, though this has not been consistent for all patients. Typically OAS is limited to itching or swelling of mucosal tissues from the lips to the back of the throat.   Information about OAS has been discussed and provided in written form.  All foods causing symptoms are to be avoided.  Should symptoms progress beyond the mouth and throat, 911 is to be called immediately.

## 2019-05-31 DIAGNOSIS — J301 Allergic rhinitis due to pollen: Secondary | ICD-10-CM | POA: Diagnosis not present

## 2019-05-31 NOTE — Telephone Encounter (Signed)
Pt. Was seen yesterday by Dr. Verlin Fester

## 2019-05-31 NOTE — Progress Notes (Addendum)
VIALS EXP 05-30-20 additional labels needed

## 2019-06-01 DIAGNOSIS — J3089 Other allergic rhinitis: Secondary | ICD-10-CM

## 2019-06-06 ENCOUNTER — Telehealth: Payer: Self-pay

## 2019-06-06 MED ORDER — AZELASTINE HCL 0.15 % NA SOLN: 2.0000 | Freq: Two times a day (BID) | NASAL | 5 refills | Status: AC | PRN

## 2019-06-06 MED ORDER — FLUTICASONE PROPIONATE 50 MCG/ACT NA SUSP: 2.0000 | Freq: Two times a day (BID) | NASAL | 5 refills | Status: AC | PRN

## 2019-06-06 NOTE — Telephone Encounter (Signed)
Dymista not covered under patient's insurance plan switch to plan coverage and will send in Flonase and Azelastine nasal spray separately.

## 2019-06-13 ENCOUNTER — Ambulatory Visit: Payer: 59

## 2019-06-27 ENCOUNTER — Ambulatory Visit (INDEPENDENT_AMBULATORY_CARE_PROVIDER_SITE_OTHER): Payer: 59

## 2019-06-27 ENCOUNTER — Telehealth: Payer: Self-pay

## 2019-06-27 ENCOUNTER — Other Ambulatory Visit: Payer: Self-pay

## 2019-06-27 DIAGNOSIS — J3089 Other allergic rhinitis: Secondary | ICD-10-CM | POA: Diagnosis not present

## 2019-06-27 MED ORDER — EPINEPHRINE 0.3 MG/0.3ML IJ SOAJ: 0.3000 mg | Freq: Once | INTRAMUSCULAR | 1 refills | Status: DC

## 2019-06-27 MED ORDER — EPINEPHRINE 0.3 MG/0.3ML IJ SOAJ: 0.3000 mg | Freq: Once | INTRAMUSCULAR | 1 refills | Status: AC

## 2019-06-27 NOTE — Progress Notes (Signed)
Immunotherapy   Patient Details  Name: Melanie Joyce MRN: [id number] Date of Birth: [birth year]  06/27/2019  [name] came in to start injections waited 30 minutes without any reactions. Following schedule: A  Frequency:1-2 times weekly with 72 hours in between. Epi-Pen:Yes  Consent signed and patient instructions given.   [name] 06/27/2019, 8:45 AM

## 2019-06-27 NOTE — Telephone Encounter (Signed)
Patient started allergy injections today and asked about taking her vials to her job and receiving her inections there. Patient works at [employer] [address] [place], [place] [address]. Patient took the forms to have them signed. Forms have been signed and faxed back. Patient's husband will be by the [facility] office on 06/29/2019 to pick up vials. Forms have been filled out and ready for pick up.

## 2019-07-04 ENCOUNTER — Other Ambulatory Visit: Payer: Self-pay

## 2019-07-04 MED ORDER — EPINEPHRINE 0.3 MG/0.3ML IJ SOAJ: 0.3000 mg | Freq: Once | INTRAMUSCULAR | 1 refills | Status: AC | PRN

## 2019-07-04 NOTE — Progress Notes (Signed)
Patient request generic mylan brand instead of Auvi-Q.

## 2019-08-29 ENCOUNTER — Ambulatory Visit: Payer: 59 | Admitting: Allergy and Immunology

## 2019-09-19 ENCOUNTER — Ambulatory Visit: Payer: 59 | Admitting: Allergy and Immunology

## 2019-10-29 ENCOUNTER — Other Ambulatory Visit: Payer: Self-pay

## 2019-10-29 ENCOUNTER — Emergency Department (HOSPITAL_BASED_OUTPATIENT_CLINIC_OR_DEPARTMENT_OTHER): Payer: 59

## 2019-10-29 ENCOUNTER — Encounter (HOSPITAL_BASED_OUTPATIENT_CLINIC_OR_DEPARTMENT_OTHER): Payer: Self-pay | Admitting: Emergency Medicine

## 2019-10-29 DIAGNOSIS — M79672 Pain in left foot: Secondary | ICD-10-CM | POA: Insufficient documentation

## 2019-10-29 DIAGNOSIS — Z79899 Other long term (current) drug therapy: Secondary | ICD-10-CM | POA: Insufficient documentation

## 2019-10-29 NOTE — ED Triage Notes (Signed)
Pt reports left sided foot pain, sudden onset tonight. Denies trauma.

## 2019-10-30 ENCOUNTER — Emergency Department (HOSPITAL_BASED_OUTPATIENT_CLINIC_OR_DEPARTMENT_OTHER)
Admission: EM | Admit: 2019-10-30 | Discharge: 2019-10-30 | Disposition: A | Discharge status: Home / Self Care | Payer: 59 | Attending: Emergency Medicine | Admitting: Emergency Medicine

## 2019-10-30 DIAGNOSIS — M79672 Pain in left foot: Secondary | ICD-10-CM

## 2019-10-30 NOTE — ED Provider Notes (Signed)
[facility] EMERGENCY DEPARTMENT Provider Note   CSN: [id number] Arrival date & time: 10/29/19  2331     History Chief Complaint  Patient presents with  . Foot Pain    Melanie Joyce is a 39 y.o. female.  HPI     This is a 39 year old female who presents with left foot pain.  Patient reports acute onset of pain in the left foot.  She states that she was laying down with her children.  No injury.  She reports sharp burning pain that originated in the left foot.  No significant radiation.  She states she took 800 mg of ibuprofen with no relief.  She states pain is worse with weightbearing.  Currently her pain is 5 out of 10.  She has never had pain like this before.  She denies any numbness or tingling of the foot.  Past Medical History:  Diagnosis Date  . Cardiac arrhythmia    PVCs  . Gestational diabetes 2013   diet controlled  . Kidney stones   . PVC (premature ventricular contraction)     Patient Active Problem List   Diagnosis Date Noted  . Seasonal and perennial allergic rhinitis 05/30/2019  . Allergic conjunctivitis 05/30/2019  . Oral allergy syndrome 05/30/2019  . Indication for care in labor or delivery 12/01/2014  . Preterm delivery 12/01/2014  . 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 SOB (shortness of breath) 07/03/2014    Past Surgical History:  Procedure Laterality Date  . CHOLECYSTECTOMY       OB History    Gravida  3   Para  3   Term  2   Preterm  1   AB      Living  3     SAB      TAB      Ectopic      Multiple  0   Live Births  3           Family History  Problem Relation Age of Onset  . Asthma Other   . Hypertension Other   . Hyperlipidemia Other   . Cancer Other   . COPD Other   . Hypertension Mother   . Thyroid cancer Mother   . Asthma Mother   . Allergic rhinitis Mother   . Hypertension Father   . Cancer Father   . Hypertension Brother   . Allergic rhinitis Brother   . Urticaria Neg Hx   . Eczema Neg  Hx     Social History   Tobacco Use  . Smoking status: Never Smoker  . Smokeless tobacco: Never Used  Vaping Use  . Vaping Use: Never used  Substance Use Topics  . Alcohol use: No    Comment: socially   . Drug use: No    Home Medications Prior to Admission medications   Medication Sig Start Date End Date Taking? Authorizing Provider  Azelastine HCl 0.15 % SOLN Place 2 sprays into the nose 2 (two) times daily as needed. 06/06/19   Bobbitt, Sedalia Muta, MD  Azelastine-Fluticasone 137-50 MCG/ACT SUSP PLACE 1-2 SPRAYS INTO THE NOSE 2 (TWO) TIMES DAILY AS NEEDED 05/31/19   Bobbitt, Sedalia Muta, MD  EPINEPHrine 0.3 mg/0.3 mL IJ SOAJ injection Inject 0.3 mLs (0.3 mg total) into the muscle once as needed for up to 1 dose for anaphylaxis. 07/04/19   Bobbitt, Sedalia Muta, MD  FLUoxetine (PROZAC) 20 MG tablet Take 20 mg by mouth daily.    [provider]  fluticasone (FLONASE) 50 MCG/ACT nasal spray Place 2 sprays into both nostrils 2 (two) times daily as needed for allergies or rhinitis. 06/06/19   Bobbitt, Sedalia Muta, MD  levocetirizine (XYZAL) 5 MG tablet Take 1 tablet (5 mg total) by mouth every evening. 05/30/19   Bobbitt, Sedalia Muta, MD  Olopatadine HCl (PATADAY) 0.2 % SOLN Place 1 drop into both eyes daily as needed. 05/30/19   Bobbitt, Sedalia Muta, MD  spironolactone (ALDACTONE) 100 MG tablet Take 100 mg by mouth daily.    [provider]  ursodiol (ACTIGALL) 300 MG capsule Take 300 mg by mouth daily.  10/25/13   [provider]  vitamin C (ASCORBIC ACID) 250 MG tablet Take 250 mg by mouth daily.    [provider]    Allergies    Patient has no known allergies.  Review of Systems   Review of Systems  Constitutional: Negative for fever.  Musculoskeletal:       Left foot pain  Skin: Positive for color change.  Neurological: Negative for numbness.  All other systems reviewed and are negative.   Physical Exam Updated Vital Signs BP 120/69 (BP  Location: Right Arm)   Pulse 87   Temp 98.3 F (36.8 C) (Oral)   Resp 18   LMP 09/19/2019   SpO2 94%   Physical Exam Vitals and nursing note reviewed.  Constitutional:      Appearance: She is well-developed. She is not ill-appearing.  HENT:     Head: Normocephalic and atraumatic.     Mouth/Throat:     Mouth: Mucous membranes are moist.  Eyes:     Pupils: Pupils are equal, round, and reactive to light.  Cardiovascular:     Rate and Rhythm: Normal rate and regular rhythm.  Pulmonary:     Effort: Pulmonary effort is normal. No respiratory distress.  Musculoskeletal:     Cervical back: Neck supple.     Comments: Tenderness palpation of the dorsum of the left foot with some mild bruising discoloration, no significant swelling, 2+ DP pulse, no obvious deformities, no tenderness over the plantar fascia, normal flexion and extension of all the digits, no warmth or erythema  Skin:    General: Skin is warm and dry.  Neurological:     Mental Status: She is alert and oriented to person, place, and time.  Psychiatric:        Mood and Affect: Mood normal.     ED Results / Procedures / Treatments   Labs (all labs ordered are listed, but only abnormal results are displayed) Labs Reviewed - No data to display  EKG None  Radiology DG Foot Complete Left  Result Date: 10/30/2019 CLINICAL DATA:  Sudden onset of left foot pain.  No known injury. EXAM: LEFT FOOT - COMPLETE 3+ VIEW COMPARISON:  None. FINDINGS: There is no evidence of fracture or dislocation. There is no evidence of arthropathy or other focal bone abnormality. Soft tissues are unremarkable. IMPRESSION: Negative radiographs of the left foot. Electronically Signed   By: Keith Rake M.D.   On: 10/30/2019 00:14    Procedures Procedures (including critical care time)  Medications Ordered in ED Medications - No data to display  ED Course  I have reviewed the triage vital signs and the nursing notes.  Pertinent labs &  imaging results that were available during my care of the patient were reviewed by me and considered in my medical decision making (see chart for details).    MDM Rules/Calculators/A&P  Patient presents with acute onset of left foot pain.  Overall nontoxic and vital signs are reassuring.  She denies injury.  Her exam is fairly benign.  She does have some slight discoloration that appears like a faint bruise.  No swelling or erythema and no warmth to suggest infection.  She is neurovascularly intact.  X-rays are negative and independently reviewed by myself.  No evidence of fracture.  Could be a small ruptured blood vessel but I feel her pain is likely out of proportion.  Recommend elevation and ice.  Naproxen for pain.  Monitor closely.  At this time of low suspicion for acute emergent issue.  After history, exam, and medical workup I feel the patient has been appropriately medically screened and is safe for discharge home. Pertinent diagnoses were discussed with the patient. Patient was given return precautions.  Final Clinical Impression(s) / ED Diagnoses Final diagnoses:  Foot pain, left    Rx / DC Orders ED Discharge Orders    None       Deionte Spivack, Barbette Hair, MD 10/30/19 [phone]

## 2019-10-30 NOTE — ED Notes (Signed)
Pt discharged to home. Discharge instructions have been discussed with patient and/or family members. Pt verbally acknowledges understanding d/c instructions, and endorses comprehension to checkout at registration before leaving.  °

## 2019-10-30 NOTE — Discharge Instructions (Addendum)
Take naproxen twice a day Aleve for pain.  Keep foot iced and elevated.  If you notice redness, swelling, worsening symptoms, you should be reevaluated.

## 2020-01-31 ENCOUNTER — Other Ambulatory Visit: Payer: Self-pay | Admitting: Obstetrics and Gynecology

## 2020-01-31 DIAGNOSIS — N6321 Unspecified lump in the left breast, upper outer quadrant: Secondary | ICD-10-CM

## 2020-02-20 ENCOUNTER — Other Ambulatory Visit: Payer: 59

## 2020-04-05 ENCOUNTER — Ambulatory Visit: Payer: Self-pay

## 2020-04-05 ENCOUNTER — Encounter: Payer: Self-pay | Admitting: Internal Medicine

## 2020-04-05 ENCOUNTER — Other Ambulatory Visit: Payer: Self-pay

## 2020-04-05 ENCOUNTER — Ambulatory Visit: Payer: 59 | Admitting: Internal Medicine

## 2020-04-05 VITALS — BP 103/57 | HR 61 | Resp 14 | Ht 65.5 in | Wt 139.0 lb

## 2020-04-05 DIAGNOSIS — R768 Other specified abnormal immunological findings in serum: Secondary | ICD-10-CM | POA: Insufficient documentation

## 2020-04-05 DIAGNOSIS — M2550 Pain in unspecified joint: Secondary | ICD-10-CM | POA: Insufficient documentation

## 2020-04-05 DIAGNOSIS — M533 Sacrococcygeal disorders, not elsewhere classified: Secondary | ICD-10-CM

## 2020-04-05 DIAGNOSIS — M25571 Pain in right ankle and joints of right foot: Secondary | ICD-10-CM

## 2020-04-05 NOTE — Progress Notes (Signed)
Office Visit Note  Patient: Melanie Joyce             Date of Birth: Apr 17, 1981           MRN: [id number]             PCP: Chesley Noon, MD Referring: Selinda Orion Visit Date: 04/05/2020 Occupation: Pediatric nurse  Subjective:  New Patient (Initial Visit) (Abnormal labs, + ANA, Sjogren's?, right hip pain, bil ankle pain, SI joint pain - no injury, symptoms began after 2nd COVID vaccine)   History of Present Illness: Melanie Joyce is a 39 y.o. female here for evaluation of positive ANA with eye irritation, right ankle pain and coccygeal and right hip pain. Symptoms started around June after second COVID19 vaccination dose. The initial onset was right ankle pain and stiffness that has persisted. This is worst first thing in the morning. She is able to exercise and bear weight on the ankle without significant worsening. She denies history of previous injury or surgery to this area. She was evaluated by her primary care doctor and referred to rheumatology on accounf of abnormal lab tests. She states previous rheumatologist checked some other tests and suspected sjogren syndrome. She was prescribed a course of prednisone that did not help symptoms very significantly. Since that time she just continues to have symptoms without resolution or rapid progression. She has also experienced eye irritation bilaterally with plan to see ophthalmology to be evaluated next Friday. She denies skin rashes, lymphadenopathy, hair loss, fevers, raynaud's phenomenon, pleurisy, or history of blood clots.   Activities of Daily Living:  Patient reports morning stiffness for 6 hours.   Patient Denies nocturnal pain.  Difficulty dressing/grooming: Denies Difficulty climbing stairs: Denies Difficulty getting out of chair: Denies Difficulty using hands for taps, buttons, cutlery, and/or writing: Denies  Review of Systems  Constitutional: Positive for fatigue.  HENT: Negative for mouth dryness.    Eyes: Positive for pain and redness.  Respiratory: Negative for shortness of breath.   Cardiovascular: Negative for swelling in legs/feet.  Gastrointestinal: Negative for constipation.  Endocrine: Negative for excessive thirst.  Genitourinary: Negative for difficulty urinating.  Musculoskeletal: Positive for arthralgias, joint pain and morning stiffness.  Skin: Negative for rash.  Allergic/Immunologic: Negative for susceptible to infections.  Neurological: Negative for numbness.  Hematological: Negative for bruising/bleeding tendency.  Psychiatric/Behavioral: Negative for sleep disturbance.    PMFS History:  Patient Active Problem List   Diagnosis Date Noted  . Positive ANA (antinuclear antibody) 04/05/2020  . Arthralgia 04/05/2020  . Coccygeal pain 04/05/2020  . Seasonal and perennial allergic rhinitis 05/30/2019  . Allergic conjunctivitis 05/30/2019  . Oral allergy syndrome 05/30/2019  . Indication for care in labor or delivery 12/01/2014  . Preterm delivery 12/01/2014  . 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 SOB (shortness of breath) 07/03/2014    Past Medical History:  Diagnosis Date  . Cardiac arrhythmia    PVCs  . Gestational diabetes 2013   diet controlled  . Kidney stones   . PVC (premature ventricular contraction)     Family History  Problem Relation Age of Onset  . Asthma Other   . Hypertension Other   . Hyperlipidemia Other   . Cancer Other   . COPD Other   . Hypertension Mother   . Thyroid cancer Mother   . Asthma Mother   . Allergic rhinitis Mother   . Alzheimer's disease Mother   . High Cholesterol Mother   .  Arthritis Mother   . Hypertension Father   . Cancer Father   . High Cholesterol Father   . Hypertension Brother   . Allergic rhinitis Brother   . Urticaria Neg Hx   . Eczema Neg Hx    Past Surgical History:  Procedure Laterality Date  . CHOLECYSTECTOMY     Social History   Social History Narrative  . Not on file   Immunization History    Administered Date(s) Administered  . PFIZER SARS-COV-2 Vaccination 08/17/2019, 09/11/2019     Objective: Vital Signs: BP (!) 103/57 (BP Location: Left Arm, Patient Position: Sitting, Cuff Size: Normal)   Pulse 61   Resp 14   Ht 5' 5.5" (1.664 m)   Wt 139 lb (63 kg)   BMI 22.78 kg/m    Physical Exam HENT:     Right Ear: External ear normal.     Left Ear: External ear normal.     Mouth/Throat:     Mouth: Mucous membranes are moist.     Pharynx: Oropharynx is clear.  Eyes:     Conjunctiva/sclera: Conjunctivae normal.  Cardiovascular:     Rate and Rhythm: Normal rate and regular rhythm.  Pulmonary:     Effort: Pulmonary effort is normal.     Breath sounds: Normal breath sounds.  Skin:    General: Skin is warm and dry.     Findings: No rash.  Neurological:     General: No focal deficit present.     Mental Status: She is alert.     Musculoskeletal Exam: Neck full range of motion no tenderness Shoulder, elbow, wrist, fingers full range of motion no tenderness or swelling No paraspinal tenderness to palpation over upper and lower back Normal hip internal and external rotation without pain, no tenderness to lateral hip palpation, right hip lateral pain with Pace and FABER maneuver Knees, ankles, MTPs full range of motion no tenderness or swelling   Investigation: No additional findings.  Imaging: XR Foot 2 Views Right  Result Date: 04/05/2020 X-ray right foot 2 views Normal tibiotalar joint space and alignment.  Midfoot joints appear normal.  Normal MTP and PIP joint spaces.  Bone mineralization appears normal.  No soft tissue calcifications or swelling seen. Impression No significant inflammatory or degenerative arthritis changes are seen  XR Pelvis 1-2 Views  Result Date: 04/05/2020 X-ray pelvis 2 view AP and Ferguson SI joints are patent bilaterally with no significant subchondral sclerosis joint space widening or narrowing.  Hip joints with minimal joint surface  irregularity bilaterally.  Bone mineralization appears normal throughout.  No soft tissue calcifications or swelling are seen. Impression No significant arthritis abnormalities are seen   Recent Labs: Lab Results  Component Value Date   WBC 8.5 05/01/2018   HGB 13.9 05/01/2018   PLT 257 05/01/2018   NA 138 05/01/2018   K 3.3 (L) 05/01/2018   CL 106 05/01/2018   CO2 23 05/01/2018   GLUCOSE 137 (H) 05/01/2018   BUN 13 05/01/2018   CREATININE 0.81 05/01/2018   BILITOT 1.0 07/11/2013   ALKPHOS 74 07/11/2013   AST 865 (H) 07/11/2013   ALT 514 (H) 07/11/2013   PROT 7.1 07/11/2013   ALBUMIN 4.0 07/11/2013   CALCIUM 9.0 05/01/2018   GFRAA >60 05/01/2018    Speciality Comments: No specialty comments available.  Procedures:  No procedures performed Allergies: Patient has no known allergies.   Assessment / Plan:     Visit Diagnoses: Positive ANA (antinuclear antibody)  She has had  previous work-up at [facility] rheumatology we will request records to obtain ANA testing to avoid repeating them at this time.  Currently she has what may be ocular symptoms for keratoconjunctivitis sicca.  Also may have related fatigue and arthralgias. We will plan to follow up in a few weeks after getting outside clinic records.  Arthralgia of right foot - Plan: XR Foot 2 Views Right  Her ankle pain seems localized to what seems like the subtalar joint.  No effusion seen on ultrasound and no significant arthritis on xray. Cannot exclude enthesitis although no obvious tendon abnormality was noted and strength is normal.  Coccygeal pain - Plan: XR Pelvis 1-2 Views  Pain at the coccyx and at the right hip but without obvious osseus abnormalities on xray. Lateral hip pain sounds more consistent with musculotendinous problem.   Orders: Orders Placed This Encounter  Procedures  . XR Pelvis 1-2 Views  . XR Foot 2 Views Right   No orders of the defined types were placed in this encounter.   Follow-Up  Instructions: Return in about 19 days (around 04/24/2020).   Collier Salina, MD  Note - This record has been created using [employer].  Chart creation errors have been sought, but may not always  have been located. Such creation errors do not reflect on  the standard of medical care.

## 2020-04-24 NOTE — Progress Notes (Addendum)
Office Visit Note  Patient: Melanie Joyce             Date of Birth: 1980-12-03           MRN: [id number]             PCP: Chesley Noon, MD Referring: Chesley Noon, MD Visit Date: 04/25/2020   Subjective:   History of Present Illness: Melanie Joyce is a 39 y.o. female here for follow up of her fatigue, dry eyes, R>L ankle pain, right hip pain, and coccygeal pain and positive ANA. Since the last visit she has noticed no significant change in symptoms. She saw her eye doctor who observed dry eye syndrome due to insufficient tear film recommended placement of plugs vs restasis. She was able to request previous lab workup from [facility]ology which is detailed below.  Labs reviewed from [facility] workup 01/2020 dsDNA neg scl-70 neg RNP neg SSA neg SSB 1.9 Smith neg ANA positive HLA-B27 neg Parvovirus IgM neg IgG 5.6  Office records document very minimal clinical response to 32m prednisone.   Review of Systems  Constitutional: Positive for fatigue.  HENT: Negative for mouth sores, mouth dryness and nose dryness.   Eyes: Positive for pain and dryness. Negative for itching and visual disturbance.  Respiratory: Negative for cough, hemoptysis, shortness of breath and difficulty breathing.   Cardiovascular: Negative for chest pain, palpitations and swelling in legs/feet.  Gastrointestinal: Negative for abdominal pain, blood in stool, constipation and diarrhea.  Endocrine: Negative for increased urination.  Genitourinary: Negative for painful urination.  Musculoskeletal: Positive for arthralgias, joint pain and morning stiffness. Negative for joint swelling, myalgias, muscle weakness, muscle tenderness and myalgias.  Skin: Negative for color change, rash and redness.  Allergic/Immunologic: Negative for susceptible to infections.  Neurological: Negative for dizziness, numbness, headaches, memory loss and weakness.  Hematological: Negative for swollen glands.   Psychiatric/Behavioral: Negative for confusion and sleep disturbance.    PMFS History:  Patient Active Problem List   Diagnosis Date Noted  . Positive ANA (antinuclear antibody) 04/05/2020  . Arthralgia 04/05/2020  . Coccygeal pain 04/05/2020  . Seasonal and perennial allergic rhinitis 05/30/2019  . Allergic conjunctivitis 05/30/2019  . Oral allergy syndrome 05/30/2019  . Indication for care in labor or delivery 12/01/2014  . Preterm delivery 12/01/2014  . 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 SOB (shortness of breath) 07/03/2014    Past Medical History:  Diagnosis Date  . Cardiac arrhythmia    PVCs  . Gestational diabetes 2013   diet controlled  . Kidney stones   . PVC (premature ventricular contraction)     Family History  Problem Relation Age of Onset  . Asthma Other   . Hypertension Other   . Hyperlipidemia Other   . Cancer Other   . COPD Other   . Hypertension Mother   . Thyroid cancer Mother   . Asthma Mother   . Allergic rhinitis Mother   . Alzheimer's disease Mother   . High Cholesterol Mother   . Arthritis Mother   . Hypertension Father   . Cancer Father   . High Cholesterol Father   . Hypertension Brother   . Allergic rhinitis Brother   . Urticaria Neg Hx   . Eczema Neg Hx    Past Surgical History:  Procedure Laterality Date  . CHOLECYSTECTOMY     Social History   Social History Narrative  . Not on file   Immunization History  Administered Date(s)  Administered  . PFIZER SARS-COV-2 Vaccination 08/17/2019, 09/11/2019     Objective: Vital Signs: BP 109/66 (BP Location: Right Arm, Patient Position: Sitting, Cuff Size: Normal)   Pulse (!) 59   Ht '5\' 5"'  (1.651 m)   Wt 139 lb 6.4 oz (63.2 kg)   BMI 23.20 kg/m    Physical Exam HENT:     Mouth/Throat:     Mouth: Mucous membranes are moist.     Pharynx: Oropharynx is clear.  Eyes:     Comments: Bilateral conjunctival injection  Skin:    General: Skin is warm and dry.     Findings: No rash.   Neurological:     General: No focal deficit present.     Mental Status: She is alert.  Psychiatric:        Mood and Affect: Mood normal.     Musculoskeletal Exam:  No paraspinal tenderness to palpation over lower back Normal hip internal and external rotation without pain, Pace FABER SLR maneuvers not provocative Right lateral hip tenderness to palpation over greater trochanter head Knees, ankles, full range of motion no tenderness or swelling   Investigation: No additional findings.  Imaging: XR Foot 2 Views Right  Result Date: 04/05/2020 X-ray right foot 2 views Normal tibiotalar joint space and alignment.  Midfoot joints appear normal.  Normal MTP and PIP joint spaces.  Bone mineralization appears normal.  No soft tissue calcifications or swelling seen. Impression No significant inflammatory or degenerative arthritis changes are seen  XR Pelvis 1-2 Views  Result Date: 04/05/2020 X-ray pelvis 2 view AP and Ferguson SI joints are patent bilaterally with no significant subchondral sclerosis joint space widening or narrowing.  Hip joints with minimal joint surface irregularity bilaterally.  Bone mineralization appears normal throughout.  No soft tissue calcifications or swelling are seen. Impression No significant arthritis abnormalities are seen   Recent Labs: Lab Results  Component Value Date   WBC 8.5 05/01/2018   HGB 13.9 05/01/2018   PLT 257 05/01/2018   NA 138 05/01/2018   K 3.3 (L) 05/01/2018   CL 106 05/01/2018   CO2 23 05/01/2018   GLUCOSE 137 (H) 05/01/2018   BUN 13 05/01/2018   CREATININE 0.81 05/01/2018   BILITOT 1.0 07/11/2013   ALKPHOS 74 07/11/2013   AST 865 (H) 07/11/2013   ALT 514 (H) 07/11/2013   PROT 7.1 07/11/2013   ALBUMIN 4.0 07/11/2013   CALCIUM 9.0 05/01/2018   GFRAA >60 05/01/2018    Speciality Comments: No specialty comments available.  Procedures:  No procedures performed Allergies: Patient has no known allergies.   Assessment /  Plan:     Visit Diagnoses: Positive ANA (antinuclear antibody)  I do not see any evidence of inflammatory joint disease and low positive SSB Ab titer is not strongly associated for this. With her dry eyes this could be related with keratoconjunctivitis sicca but has no mouth problems and weak Abs. Lack of clinical response to moderate dose prednisone is also negatively predictive. I do not suspect this causing her symptoms currently and no new treatment recommended.  Coccygeal pain Arthralgia of right foot  I do not see significant OA changes to explain the symptoms and lateral hip pain does not radiate into the hip and has fully intact ROM. I recommended she could try use of turmeric or glucosamine supplements versus NSAIDs for symptoms. No indication for injection or aspiration. If this worsens recommended f/u to orthopedics or sports medicine for noninflammatory joint pain.  Orders: No orders  of the defined types were placed in this encounter.  No orders of the defined types were placed in this encounter.    Follow-Up Instructions: Return if symptoms worsen or fail to improve.   Collier Salina, MD   Addendum 06/17/20  Office records from [facility] were received and reviewed of office note 01/2020.  Labs reviewed include RF negative, CCP negative, ESR 2, ANA positive, SSB 1.9 rest of ENA negative, vitamin D 39.2. No inflammatory joint changes were noted and ANA was felt to be nonspecific with unremarkable serology and no clinical features.

## 2020-04-25 ENCOUNTER — Encounter: Payer: Self-pay | Admitting: Internal Medicine

## 2020-04-25 ENCOUNTER — Other Ambulatory Visit: Payer: Self-pay

## 2020-04-25 ENCOUNTER — Ambulatory Visit: Payer: 59 | Admitting: Internal Medicine

## 2020-04-25 VITALS — BP 109/66 | HR 59 | Ht 65.0 in | Wt 139.4 lb

## 2020-04-25 DIAGNOSIS — M25571 Pain in right ankle and joints of right foot: Secondary | ICD-10-CM | POA: Diagnosis not present

## 2020-04-25 DIAGNOSIS — R768 Other specified abnormal immunological findings in serum: Secondary | ICD-10-CM | POA: Diagnosis not present

## 2020-04-25 DIAGNOSIS — M533 Sacrococcygeal disorders, not elsewhere classified: Secondary | ICD-10-CM | POA: Diagnosis not present

## 2020-05-17 ENCOUNTER — Ambulatory Visit: Payer: 59 | Admitting: Orthopaedic Surgery

## 2020-05-17 ENCOUNTER — Ambulatory Visit: Payer: Self-pay

## 2020-05-17 ENCOUNTER — Encounter: Payer: Self-pay | Admitting: Orthopaedic Surgery

## 2020-05-17 DIAGNOSIS — M25571 Pain in right ankle and joints of right foot: Secondary | ICD-10-CM

## 2020-05-17 MED ORDER — LIDOCAINE HCL 1 % IJ SOLN
1.0000 mL | INTRAMUSCULAR | Status: AC | PRN
  Administered 2020-05-17: 1 mL

## 2020-05-17 MED ORDER — BUPIVACAINE HCL 0.5 % IJ SOLN
1.0000 mL | INTRAMUSCULAR | Status: AC | PRN
  Administered 2020-05-17: 1 mL via INTRA_ARTICULAR

## 2020-05-17 MED ORDER — METHYLPREDNISOLONE ACETATE 40 MG/ML IJ SUSP
40.0000 mg | INTRAMUSCULAR | Status: AC | PRN
  Administered 2020-05-17: 40 mg via INTRA_ARTICULAR

## 2020-05-17 NOTE — Progress Notes (Signed)
Office Visit Note   Patient: Melanie Joyce           Date of Birth: 1981-02-16           MRN: [id number] Visit Date: 05/17/2020              Requested by: Chesley Noon, MD [place],  [place] [id number] PCP: Chesley Noon, MD   Assessment & Plan: Visit Diagnoses:  1. Pain in right ankle and joints of right foot     Plan: Impression is right ankle pain.  I feel that the process sounds like early arthritis of the ankle or subtalar joint.  X-rays are unremarkable.  Clinical exam is unremarkable.  We have discussed injecting the ankle with cortisone as well as immobilizing it with a cam walker.  She would like to proceed with injection and CAM boot.  She will follow-up with [place] in 3 to 4 weeks for recheck.  Activity restrictions discussed.  Call with concerns or questions in the meantime.  Follow-Up Instructions: Return if symptoms worsen or fail to improve.   Orders:  Orders Placed This Encounter  Procedures  . XR Ankle Complete Right   No orders of the defined types were placed in this encounter.     Procedures: Medium Joint Inj: R ankle on 05/17/2020 3:34 PM Indications: pain Details: 25 G needle Medications: 1 mL lidocaine 1 %; 40 mg methylPREDNISolone acetate 40 MG/ML; 1 mL bupivacaine 0.5 % Outcome: tolerated well, no immediate complications Patient was prepped and draped in the usual sterile fashion.       Clinical Data: No additional findings.   Subjective: Chief Complaint  Patient presents with  . Right Ankle - Pain    HPI patient is a pleasant 40 year old female who comes in today with right ankle pain.  This began about 6 or 7 months ago which was 4 weeks after receiving her second COVID-vaccine.  No specific injury or change in activity.  Pain she has is deep within the normal of the ankle.  She notes that her pain is worse first thing in the morning and intermittently throughout the day without any specific aggravators.  She has  been taking ibuprofen and using ice and heat without relief of symptoms.  She has even been worked up by rheumatologist with a positive ANA and possible Sjogren's.  She has been on steroid packs without relief of symptoms.  She denies any paresthesias to the ankle/foot.  Review of Systems as detailed in HPI.  All others reviewed and are negative.   Objective: Vital Signs: There were no vitals taken for this visit.  Physical Exam well-developed well-nourished female no acute distress.  Alert and oriented x3.  Ortho Exam right ankle exam shows no swelling.  No tenderness to the medial, anterior and lateral ankle.  She has full and painless range of motion and strength.  She is neurovascular intact distally.  Specialty Comments:  No specialty comments available.  Imaging: XR Ankle Complete Right  Result Date: 05/17/2020 No acute or structural abnormalities    PMFS History: Patient Active Problem List   Diagnosis Date Noted  . Positive ANA (antinuclear antibody) 04/05/2020  . Arthralgia 04/05/2020  . Coccygeal pain 04/05/2020  . Seasonal and perennial allergic rhinitis 05/30/2019  . Allergic conjunctivitis 05/30/2019  . Oral allergy syndrome 05/30/2019  . Indication for care in labor or delivery 12/01/2014  . Preterm delivery 12/01/2014  . 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  SOB (shortness of breath) 07/03/2014   Past Medical History:  Diagnosis Date  . Cardiac arrhythmia    PVCs  . Gestational diabetes 2013   diet controlled  . Kidney stones   . PVC (premature ventricular contraction)     Family History  Problem Relation Age of Onset  . Asthma Other   . Hypertension Other   . Hyperlipidemia Other   . Cancer Other   . COPD Other   . Hypertension Mother   . Thyroid cancer Mother   . Asthma Mother   . Allergic rhinitis Mother   . Alzheimer's disease Mother   . High Cholesterol Mother   . Arthritis Mother   . Hypertension Father   . Cancer Father   . High Cholesterol  Father   . Hypertension Brother   . Allergic rhinitis Brother   . Urticaria Neg Hx   . Eczema Neg Hx     Past Surgical History:  Procedure Laterality Date  . CHOLECYSTECTOMY     Social History   Occupational History  . Not on file  Tobacco Use  . Smoking status: Never Smoker  . Smokeless tobacco: Never Used  Vaping Use  . Vaping Use: Never used  Substance and Sexual Activity  . Alcohol use: Yes    Comment: socially   . Drug use: No  . Sexual activity: Not Currently

## 2020-06-14 ENCOUNTER — Ambulatory Visit: Payer: 59 | Admitting: Orthopaedic Surgery

## 2022-01-13 IMAGING — DX DG FOOT COMPLETE 3+V*L*
3 series · 3 of 3 positions shown · non-contrast
Comparison: None.

CLINICAL DATA: Sudden onset of left foot pain.  No known injury.

EXAM:
LEFT FOOT - COMPLETE 3+ VIEW

[foot ap]
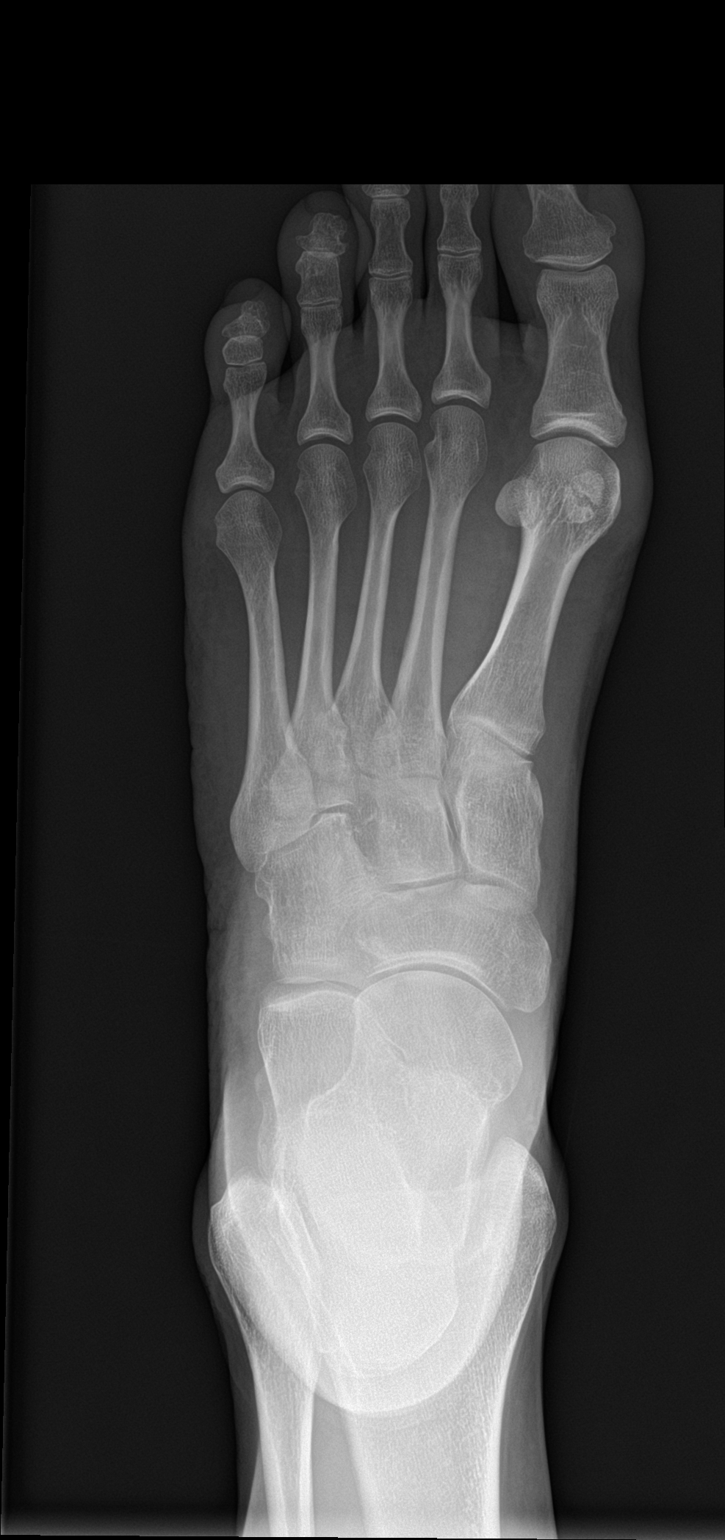

[foot obl]
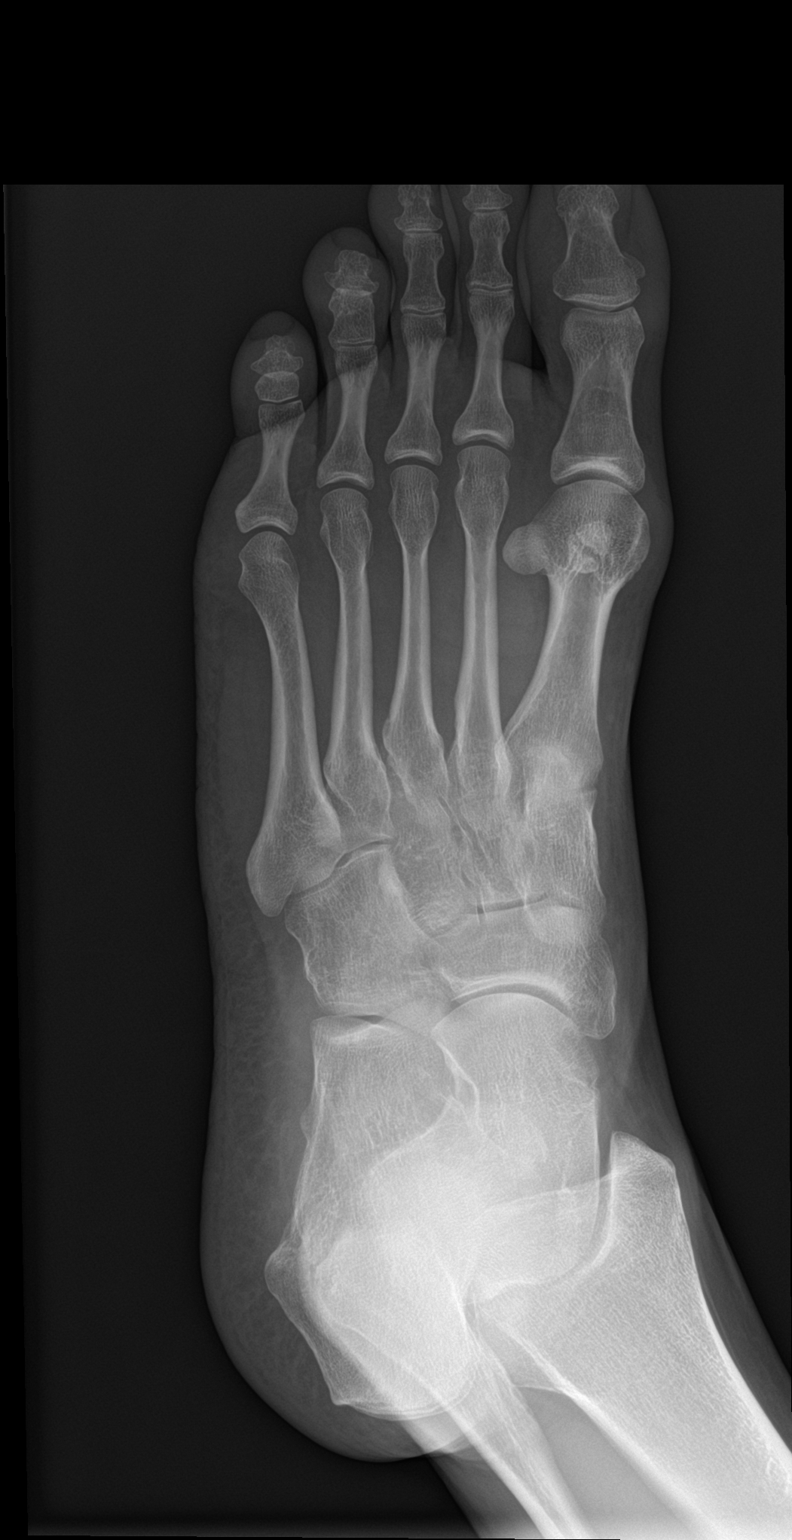

[foot lat]
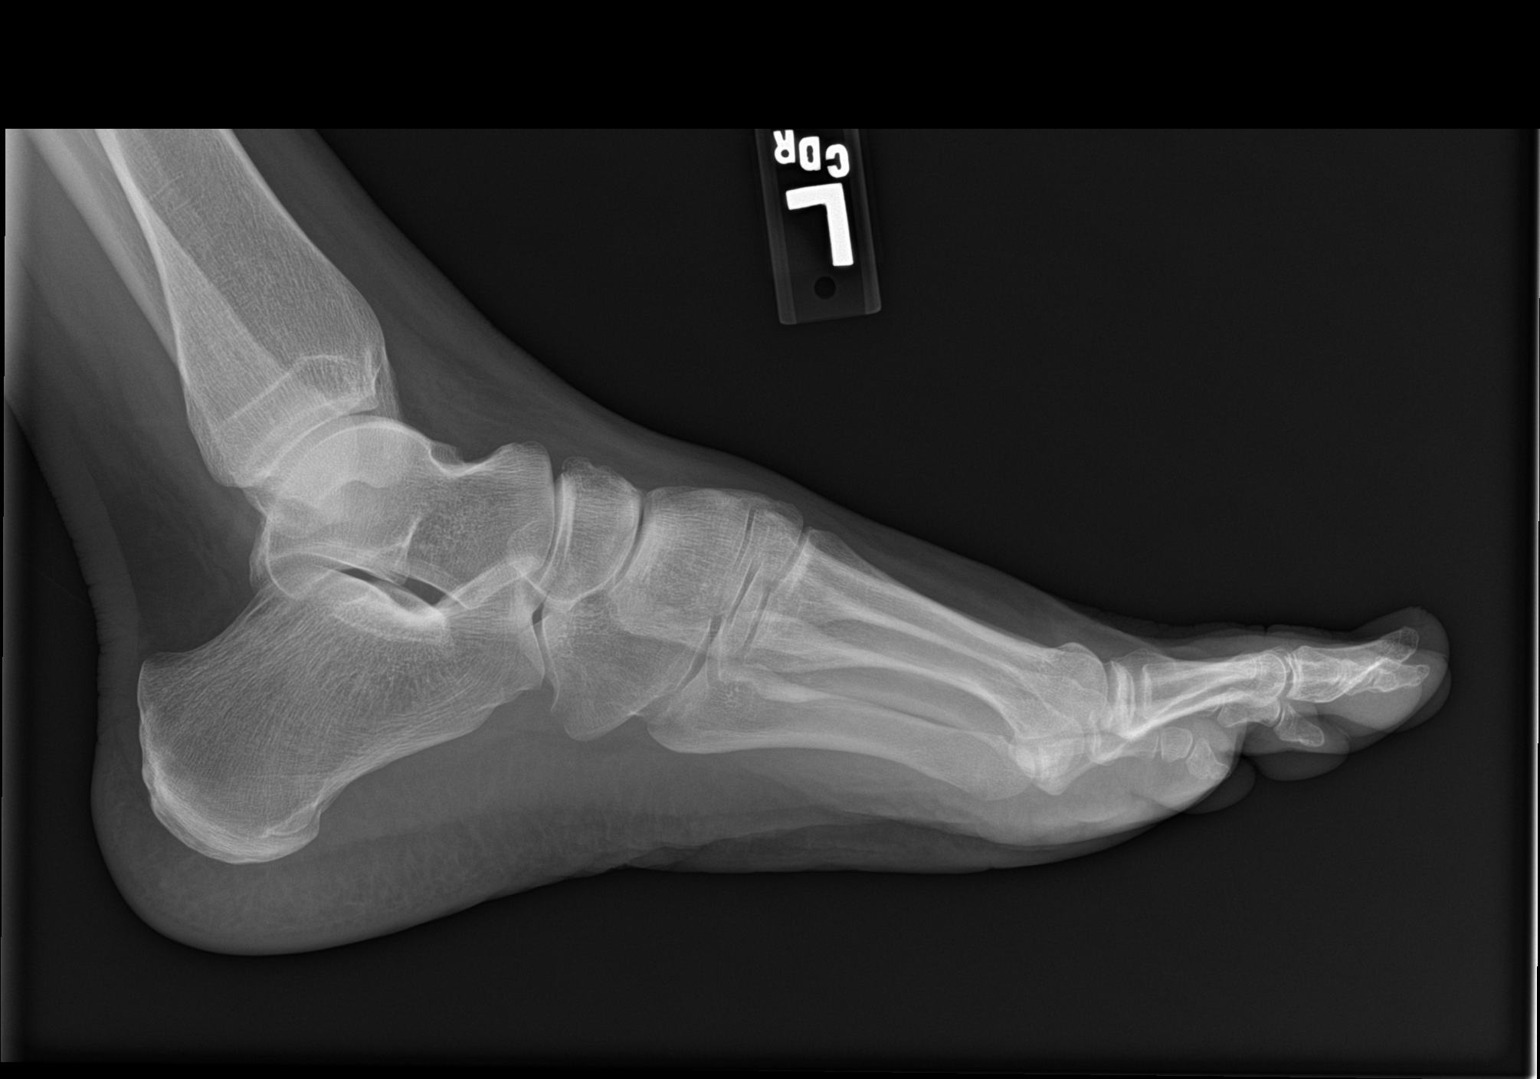

[3 of 3 positions shown; findings below may reference images not displayed]

FINDINGS: There is no evidence of fracture or dislocation. There is no
evidence of arthropathy or other focal bone abnormality. Soft
tissues are unremarkable.
IMPRESSION: Negative radiographs of the left foot.

## 2022-08-27 ENCOUNTER — Other Ambulatory Visit: Payer: Self-pay

## 2022-08-27 ENCOUNTER — Encounter (HOSPITAL_BASED_OUTPATIENT_CLINIC_OR_DEPARTMENT_OTHER): Payer: Self-pay | Admitting: *Deleted

## 2022-08-27 ENCOUNTER — Emergency Department (HOSPITAL_BASED_OUTPATIENT_CLINIC_OR_DEPARTMENT_OTHER)
Admission: EM | Admit: 2022-08-27 | Discharge: 2022-08-27 | Disposition: A | Discharge status: Home / Self Care | Payer: 59 | Attending: Emergency Medicine | Admitting: Emergency Medicine

## 2022-08-27 ENCOUNTER — Emergency Department (HOSPITAL_BASED_OUTPATIENT_CLINIC_OR_DEPARTMENT_OTHER): Payer: 59

## 2022-08-27 DIAGNOSIS — D72829 Elevated white blood cell count, unspecified: Secondary | ICD-10-CM | POA: Insufficient documentation

## 2022-08-27 DIAGNOSIS — R7401 Elevation of levels of liver transaminase levels: Secondary | ICD-10-CM | POA: Insufficient documentation

## 2022-08-27 DIAGNOSIS — R1013 Epigastric pain: Secondary | ICD-10-CM

## 2022-08-27 HISTORY — DX: Spasm of sphincter of Oddi: K83.4

## 2022-08-27 LAB — COMPREHENSIVE METABOLIC PANEL
ALT: 121 U/L — ABNORMAL HIGH (ref 0–44)
AST: 133 U/L — ABNORMAL HIGH (ref 15–41)
Albumin: 4.5 g/dL (ref 3.5–5.0)
Alkaline Phosphatase: 85 U/L (ref 38–126)
Anion gap: 10 (ref 5–15)
BUN: 13 mg/dL (ref 6–20)
CO2: 20 mmol/L — ABNORMAL LOW (ref 22–32)
Calcium: 9 mg/dL (ref 8.9–10.3)
Chloride: 106 mmol/L (ref 98–111)
Creatinine, Ser: 0.64 mg/dL (ref 0.44–1.00)
GFR, Estimated: >60 mL/min (ref >60)
Glucose, Bld: 105 mg/dL — ABNORMAL HIGH (ref 70–99)
Potassium: 3.6 mmol/L (ref 3.5–5.1)
Sodium: 136 mmol/L (ref 135–145)
Total Bilirubin: 1.2 mg/dL (ref 0.3–1.2)
Total Protein: 7.1 g/dL (ref 6.5–8.1)

## 2022-08-27 LAB — CBC
HCT: 40.1 % (ref 36.0–46.0)
Hemoglobin: 13.8 g/dL (ref 12.0–15.0)
MCH: 29.4 pg (ref 26.0–34.0)
MCHC: 34.4 g/dL (ref 30.0–36.0)
MCV: 85.5 fL (ref 80.0–100.0)
Platelets: 224 10*3/uL (ref 150–400)
RBC: 4.69 MIL/uL (ref 3.87–5.11)
RDW: 13 % (ref 11.5–15.5)
WBC: 15.1 10*3/uL — ABNORMAL HIGH (ref 4.0–10.5)
nRBC: 0 % (ref 0.0–0.2)

## 2022-08-27 LAB — URINALYSIS, ROUTINE W REFLEX MICROSCOPIC
Glucose, UA: NEGATIVE mg/dL
Ketones, ur: NEGATIVE mg/dL
Leukocytes,Ua: NEGATIVE
Nitrite: NEGATIVE
Protein, ur: NEGATIVE mg/dL
Specific Gravity, Urine: 1.02 (ref 1.005–1.030)
pH: 7 (ref 5.0–8.0)

## 2022-08-27 LAB — URINALYSIS, MICROSCOPIC (REFLEX): Bacteria, UA: NONE SEEN

## 2022-08-27 LAB — PREGNANCY, URINE: Preg Test, Ur: NEGATIVE

## 2022-08-27 LAB — LIPASE, BLOOD: Lipase: <10 U/L — ABNORMAL LOW (ref 11–51)

## 2022-08-27 MED ORDER — SODIUM CHLORIDE 0.9 % IV BOLUS
500.0000 mL | Freq: Once | INTRAVENOUS | Status: AC
  Administered 2022-08-27: 500 mL via INTRAVENOUS

## 2022-08-27 MED ORDER — MORPHINE SULFATE (PF) 4 MG/ML IV SOLN
4.0000 mg | Freq: Once | INTRAVENOUS | Status: AC
  Administered 2022-08-27: 4 mg via INTRAVENOUS
  Filled 2022-08-27: qty 1

## 2022-08-27 MED ORDER — HYDROMORPHONE HCL 1 MG/ML IJ SOLN
0.5000 mg | Freq: Once | INTRAMUSCULAR | Status: AC
  Administered 2022-08-27: 0.5 mg via INTRAVENOUS
  Filled 2022-08-27: qty 1

## 2022-08-27 MED ORDER — ONDANSETRON HCL 4 MG/2ML IJ SOLN
4.0000 mg | Freq: Once | INTRAMUSCULAR | Status: AC
  Administered 2022-08-27: 4 mg via INTRAVENOUS
  Filled 2022-08-27: qty 2

## 2022-08-27 MED ORDER — OXYCODONE-ACETAMINOPHEN 5-325 MG PO TABS: 1.0000 | ORAL_TABLET | Freq: Four times a day (QID) | ORAL | 0 refills | Status: DC | PRN

## 2022-08-27 MED ORDER — IOHEXOL 300 MG/ML  SOLN
100.0000 mL | Freq: Once | INTRAMUSCULAR | Status: AC | PRN
  Administered 2022-08-27: 80 mL via INTRAVENOUS

## 2022-08-27 MED ORDER — PANTOPRAZOLE SODIUM 40 MG IV SOLR
40.0000 mg | Freq: Once | INTRAVENOUS | Status: AC
  Administered 2022-08-27: 40 mg via INTRAVENOUS
  Filled 2022-08-27: qty 10

## 2022-08-27 MED ORDER — OXYCODONE-ACETAMINOPHEN 5-325 MG PO TABS: 1.0000 | ORAL_TABLET | Freq: Four times a day (QID) | ORAL | 0 refills | Status: AC | PRN

## 2022-08-27 NOTE — Discharge Instructions (Signed)
Please call your GI specialist regarding recent symptoms and ER visit.  As we discussed you decided to try outpatient therapy as opposed to being admitted for your pain and so I will prescribe for you Percocets that he may take as prescribed.  Please take Tylenol every 6 hours as needed for pain and that these Percocets are used for breakthrough pain.  If you begin having continuing symptoms or worsening symptoms please return to ER.

## 2022-08-27 NOTE — ED Triage Notes (Signed)
Upper abdominal pain since 5am this am. Pt has had nausea and light headedness with this.

## 2022-08-27 NOTE — ED Provider Notes (Signed)
[place] EMERGENCY DEPARTMENT AT [facility] Provider Note   CSN: [id number] Arrival date & time: 08/27/22  1559     History  Chief Complaint  Patient presents with   Abdominal Pain    Melanie Joyce is a 42 y.o. female history of center of Oddi dysfunction, nephrolithiasis presented with severe epigastric pain that began this morning.  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  Patient has had ERCP and EUS done in 2016.  Patient states she went and saw PCP last week and had no symptoms.  Patient has GI appointment in May with her GI specialist.  Patient states this morning she began having severe epigastric pain that feels similar to previous episodes in the past in relation to her dysfunction of sphincter of Oddi.  Patient has been unable to eat or taken fluids due to the pain.  Pain does not radiate as it stays in the epigastric region and hurts with movement.  Patient denies chest pain, shortness of breath, nausea/vomit/diarrhea, fever, neck pain, back pain, hematuria, flank pain, change in sensation/motor skills  Home Medications Prior to Admission medications   Medication Sig Start Date End Date Taking? Authorizing Provider  Azelastine HCl 0.15 % SOLN Place 2 sprays into the nose 2 (two) times daily as needed. Patient not taking: No sig reported 06/06/19   Bobbitt, Heywood Iles, MD  Azelastine-Fluticasone 137-50 MCG/ACT SUSP PLACE 1-2 SPRAYS INTO THE NOSE 2 (TWO) TIMES DAILY AS NEEDED Patient not taking: No sig reported 05/31/19   Bobbitt, Heywood Iles, MD  EPINEPHrine 0.3 mg/0.3 mL IJ SOAJ injection Inject 0.3 mLs (0.3 mg total) into the muscle once as needed for up to 1 dose for anaphylaxis. Patient not taking: No sig reported 07/04/19   Bobbitt, Heywood Iles, MD  FLUoxetine (PROZAC) 10 MG capsule Take 10 mg by mouth daily. Patient not taking: Reported on 04/25/2020 02/01/20   [provider]  FLUoxetine (PROZAC) 20 MG  tablet Take 20 mg by mouth as needed.    [provider]  fluticasone (FLONASE) 50 MCG/ACT nasal spray Place 2 sprays into both nostrils 2 (two) times daily as needed for allergies or rhinitis. Patient not taking: No sig reported 06/06/19   Bobbitt, Heywood Iles, MD  levocetirizine (XYZAL) 5 MG tablet Take 1 tablet (5 mg total) by mouth every evening. Patient not taking: No sig reported 05/30/19   Bobbitt, Heywood Iles, MD  Olopatadine HCl (PATADAY) 0.2 % SOLN Place 1 drop into both eyes daily as needed. Patient not taking: No sig reported 05/30/19   Bobbitt, Heywood Iles, MD  oxyCODONE-acetaminophen (PERCOCET/ROXICET) 5-325 MG tablet Take 1 tablet by mouth every 6 (six) hours as needed for up to 3 days for severe pain. 08/27/22 08/30/22  Netta Corrigan, PA-C  spironolactone (ALDACTONE) 100 MG tablet Take 100 mg by mouth daily.    [provider]  ursodiol (ACTIGALL) 300 MG capsule Take 300 mg by mouth daily.  10/25/13   [provider]  vitamin C (ASCORBIC ACID) 250 MG tablet Take 250 mg by mouth daily. Patient not taking: No sig reported    [provider]      Allergies    Patient has no known allergies.    Review of Systems   Review of Systems  Gastrointestinal:  Positive for abdominal pain.  See HPI  Physical Exam Updated Vital Signs BP (!) 97/49   Pulse 71   Temp 98.5 F (36.9  C)   Resp (!) 22   Wt 65.3 kg   LMP 08/06/2022 (Approximate)   SpO2 97%   BMI 23.96 kg/m  Physical Exam Vitals reviewed.  Constitutional:      General: She is in acute distress.  HENT:     Head: Normocephalic and atraumatic.  Eyes:     Extraocular Movements: Extraocular movements intact.     Conjunctiva/sclera: Conjunctivae normal.     Pupils: Pupils are equal, round, and reactive to light.  Cardiovascular:     Rate and Rhythm: Normal rate and regular rhythm.     Pulses: Normal pulses.     Heart sounds: Normal heart sounds.     Comments: 2+ bilateral  radial/dorsalis pedis pulses with regular rate Pulmonary:     Effort: Pulmonary effort is normal. No respiratory distress.     Breath sounds: Normal breath sounds.  Abdominal:     Palpations: Abdomen is soft.     Tenderness: There is abdominal tenderness (Epigastric). There is guarding (Epigastric). There is no right CVA tenderness, left CVA tenderness or rebound.  Musculoskeletal:        General: Normal range of motion.     Cervical back: Normal range of motion and neck supple.     Comments: 5 out of 5 bilateral grip/leg extension strength  Skin:    General: Skin is warm and dry.     Capillary Refill: Capillary refill takes less than 2 seconds.  Neurological:     General: No focal deficit present.     Mental Status: She is alert and oriented to person, place, and time.     Comments: Sensation intact in all 4 limbs  Psychiatric:        Mood and Affect: Mood normal.     ED Results / Procedures / Treatments   Labs (all labs ordered are listed, but only abnormal results are displayed) Labs Reviewed  LIPASE, BLOOD - Abnormal; Notable for the following components:      Result Value   Lipase <10 (*)    All other components within normal limits  COMPREHENSIVE METABOLIC PANEL - Abnormal; Notable for the following components:   CO2 20 (*)    Glucose, Bld 105 (*)    AST 133 (*)    ALT 121 (*)    All other components within normal limits  CBC - Abnormal; Notable for the following components:   WBC 15.1 (*)    All other components within normal limits  URINALYSIS, ROUTINE W REFLEX MICROSCOPIC - Abnormal; Notable for the following components:   Hgb urine dipstick TRACE (*)    Bilirubin Urine SMALL (*)    All other components within normal limits  PREGNANCY, URINE  URINALYSIS, MICROSCOPIC (REFLEX)    EKG EKG Interpretation  Date/Time:  Thursday August 27 2022 16:26:22 EDT Ventricular Rate:  78 PR Interval:  158 QRS Duration: 78 QT Interval:  368 QTC Calculation: 419 R  Axis:   89 Text Interpretation: Normal sinus rhythm with sinus arrhythmia Normal ECG When compared with ECG of 01-May-2018 01:19, No significant change was found since last tracing no significant change Confirmed by Rolan Bucco [phone]) on 08/27/2022 5:13:04 PM  Radiology CT ABDOMEN PELVIS W CONTRAST  Result Date: 08/27/2022 CLINICAL DATA:  Upper abdominal pain starting at 5 a.m. with nausea and lightheadedness. EXAM: CT ABDOMEN AND PELVIS WITH CONTRAST TECHNIQUE: Multidetector CT imaging of the abdomen and pelvis was performed using the standard protocol following bolus administration of intravenous contrast. RADIATION DOSE REDUCTION:  This exam was performed according to the departmental dose-optimization program which includes automated exposure control, adjustment of the mA and/or kV according to patient size and/or use of iterative reconstruction technique. CONTRAST:  80mL OMNIPAQUE IOHEXOL 300 MG/ML  SOLN COMPARISON:  07/11/2013 CT scan FINDINGS: Lower chest: Linear subsegmental atelectasis or scarring medially in the right middle lobe. Hepatobiliary: Cholecystectomy.  Otherwise unremarkable. Pancreas: Unremarkable Spleen: Unremarkable Adrenals/Urinary Tract: Unremarkable Stomach/Bowel: Mild wall thickening in the stomach antrum is probably from nondistention, less likely from antritis. No dilated bowel. Normal appendix. Vascular/Lymphatic: Unremarkable Reproductive: Incidental corpus luteum in the left ovary. No significant abnormality observed. Other: No supplemental non-categorized findings. Musculoskeletal: Transitional L5 vertebra. Mild bilateral foraminal stenosis at L4-5 due to disc bulge and facet arthropathy. IMPRESSION: 1. A specific cause for the patient's upper abdominal pain is not identified. 2. Mild wall thickening in the stomach antrum is probably from nondistention, less likely from antritis. 3. Mild bilateral foraminal stenosis at L4-5 due to disc bulge and facet arthropathy.  Transitional L5 morphology. Electronically Signed   By: Gaylyn Rong M.D.   On: 08/27/2022 18:48    Procedures Procedures    Medications Ordered in ED Medications  morphine (PF) 4 MG/ML injection 4 mg (4 mg Intravenous Given 08/27/22 1746)  sodium chloride 0.9 % bolus 500 mL (0 mLs Intravenous Stopped 08/27/22 1903)  iohexol (OMNIPAQUE) 300 MG/ML solution 100 mL (80 mLs Intravenous Contrast Given 08/27/22 1753)  HYDROmorphone (DILAUDID) injection 0.5 mg (0.5 mg Intravenous Given 08/27/22 1903)  sodium chloride 0.9 % bolus 500 mL (0 mLs Intravenous Stopped 08/27/22 2045)  pantoprazole (PROTONIX) injection 40 mg (40 mg Intravenous Given 08/27/22 2027)  HYDROmorphone (DILAUDID) injection 0.5 mg (0.5 mg Intravenous Given 08/27/22 2027)  ondansetron (ZOFRAN) injection 4 mg (4 mg Intravenous Given 08/27/22 2043)    ED Course/ Medical Decision Making/ A&P                             Medical Decision Making Amount and/or Complexity of Data Reviewed Labs: ordered. Radiology: ordered.  Risk Prescription drug management.   Melanie Joyce 42 y.o. presented today for epigastric abdominal pain. Working DDx that I considered at this time includes, but not limited to, gastroenteritis, colitis, small bowel obstruction, appendicitis, cholecystitis, pancreatitis, nephrolithiasis, AAA, UTI, pyleonephritis, ruptured ectopic pregnancy, PID, ovarian torsion.  R/o DDx: gastroenteritis, colitis, small bowel obstruction, appendicitis, cholecystitis, pancreatitis, nephrolithiasis, AAA, UTI, pyleonephritis, ruptured ectopic pregnancy, PID, ovarian torsion: These are considered less likely due to history of present illness and physical exam findings.  Review of prior external notes: 08/24/2022 office visit  Unique Tests and My Interpretation:  CBC with differential: Leukocytosis 15.1 CMP: Transaminitis AST 133, ALT 121 Lipase: Negative UA: Trace hemoglobin Urine Pregnancy: Negative EKG: Rate, rhythm,  axis, intervals all examined and without medically relevant abnormality. ST segments without concerns for elevations CT Abd/Pelvis with contrast: Mild stomach wall thickening and antrum from distention, no acute changes  Discussion with Independent Historian:  Husband  Discussion of Management of Tests:  Magod, MD GI  Risk: Medium: prescription drug management  Risk Stratification Score: none  Staffed with Fredderick Phenix, MD   Plan: Patient presented for epigastric abdominal pain. On exam patient was in pain with stable vitals.  Patient was tender to the epigastric region with guarding but otherwise had an unremarkable physical exam.  Patient states she has a history of attacks with her sphincter of Oddi and thinks that is what  is causing her pain today.  Patient was given morphine and fluids for symptom management and a CT abdomen pelvis with contrast was ordered due to patient having transaminitis and white count and presentation.  Upon chart review on 08/24/2022 patient had labs drawn that showed AST/ALT 17/16, WBC 8.2.  Patient verbally excepted morphine but states that she does not like opioids as they give her headaches.  Patient will be monitored at this time and is stable.  Patient CT came back negative for any acute changes that would explain patient's abdominal pain.  Patient does have transaminitis with a white count that is new from 3 days ago.  Patient endorsed abdominal pain with morphine and after speaking with her patient verbalized again as to try 0.5 mg Dilaudid for symptom management.  On recheck patient stated the Dilaudid did bring the pain down but that she is still endorsing pain.  Patient will be given the other 500 mL of fluids and monitored as the Dilaudid is only been given 10 minutes ago.  GI will be consulted due to patient's ongoing pain and lab results.  I spoke with GI and GI stated that patient could be discharged even with the transaminitis and leukocytosis.  On recheck  patient stated that her pain continued to ease with the Dilaudid.  I updated the patient on my conversation with the GI specialist and had shared decision-making with the patient.  Patient states she feels that her pain can be managed at home with Percocets and would like to be discharged as opposed to being admitted for pain control.  I spoke to the patient about red flag symptoms including severe pain not controlled with Percocet, intractable nausea and vomiting, fevers and to return to ED if she begins experiencing the symptoms.  Patient was given return precautions.patient stable for discharge at this time.  Patient verbalized understanding of plan.         Final Clinical Impression(s) / ED Diagnoses Final diagnoses:  Epigastric pain    Rx / DC Orders ED Discharge Orders          Ordered    oxyCODONE-acetaminophen (PERCOCET/ROXICET) 5-325 MG tablet  Every 6 hours PRN,   Status:  Discontinued        08/27/22 2140    oxyCODONE-acetaminophen (PERCOCET/ROXICET) 5-325 MG tablet  Every 6 hours PRN        08/27/22 2142              Remi Deter 08/27/22 2143    Rolan Bucco, MD 08/27/22 2219

## 2023-02-25 ENCOUNTER — Other Ambulatory Visit: Payer: Self-pay | Admitting: Obstetrics and Gynecology

## 2023-02-25 DIAGNOSIS — R92343 Mammographic extreme density, bilateral breasts: Secondary | ICD-10-CM

## 2023-04-11 ENCOUNTER — Other Ambulatory Visit: Payer: 59

## 2023-05-16 ENCOUNTER — Other Ambulatory Visit: Payer: 59

## 2023-06-13 ENCOUNTER — Other Ambulatory Visit: Payer: 59
